# Patient Record
Sex: Female | Born: 1940 | Race: White | Hispanic: No | Smoking: Never smoker
Health system: Southern US, Community
[De-identification: ages and names within clinical notes are randomized; demographics above are authoritative.]

## PROBLEM LIST (undated history)

## (undated) DIAGNOSIS — Z950 Presence of cardiac pacemaker: Secondary | ICD-10-CM

## (undated) DIAGNOSIS — I10 Essential (primary) hypertension: Secondary | ICD-10-CM

## (undated) DIAGNOSIS — I480 Paroxysmal atrial fibrillation: Secondary | ICD-10-CM

## (undated) DIAGNOSIS — I495 Sick sinus syndrome: Secondary | ICD-10-CM

## (undated) DIAGNOSIS — E785 Hyperlipidemia, unspecified: Secondary | ICD-10-CM

## (undated) HISTORY — DX: Hyperlipidemia, unspecified: E78.5

## (undated) HISTORY — DX: Presence of cardiac pacemaker: Z95.0

## (undated) HISTORY — DX: Essential (primary) hypertension: I10

## (undated) HISTORY — DX: Paroxysmal atrial fibrillation: I48.0

## (undated) HISTORY — DX: Sick sinus syndrome: I49.5

---

## 2004-04-23 ENCOUNTER — Ambulatory Visit: Payer: Self-pay | Admitting: Cardiology

## 2004-11-06 ENCOUNTER — Ambulatory Visit: Payer: Self-pay | Admitting: Internal Medicine

## 2004-11-11 ENCOUNTER — Ambulatory Visit: Payer: Self-pay | Admitting: Internal Medicine

## 2005-01-20 ENCOUNTER — Ambulatory Visit: Payer: Self-pay | Admitting: Internal Medicine

## 2005-02-12 ENCOUNTER — Ambulatory Visit: Payer: Self-pay | Admitting: Internal Medicine

## 2005-02-12 ENCOUNTER — Ambulatory Visit (HOSPITAL_COMMUNITY): Admission: RE | Admit: 2005-02-12 | Discharge: 2005-02-12 | Payer: Self-pay | Admitting: Internal Medicine

## 2005-04-02 ENCOUNTER — Ambulatory Visit: Payer: Self-pay | Admitting: Internal Medicine

## 2005-04-24 ENCOUNTER — Ambulatory Visit: Payer: Self-pay | Admitting: Cardiology

## 2005-10-08 ENCOUNTER — Ambulatory Visit: Payer: Self-pay | Admitting: Internal Medicine

## 2006-05-10 ENCOUNTER — Ambulatory Visit: Payer: Self-pay | Admitting: Cardiology

## 2006-09-17 ENCOUNTER — Ambulatory Visit: Payer: Self-pay | Admitting: Physician Assistant

## 2006-09-23 ENCOUNTER — Ambulatory Visit: Payer: Self-pay | Admitting: Cardiology

## 2006-09-24 ENCOUNTER — Ambulatory Visit: Payer: Self-pay | Admitting: Cardiology

## 2006-10-29 ENCOUNTER — Ambulatory Visit: Payer: Self-pay | Admitting: Gastroenterology

## 2007-01-10 ENCOUNTER — Ambulatory Visit: Payer: Self-pay | Admitting: Cardiology

## 2008-10-24 ENCOUNTER — Ambulatory Visit: Payer: Self-pay | Admitting: Cardiology

## 2008-10-30 ENCOUNTER — Ambulatory Visit: Payer: Self-pay | Admitting: Cardiology

## 2008-11-07 ENCOUNTER — Telehealth: Payer: Self-pay | Admitting: Cardiology

## 2008-11-09 ENCOUNTER — Encounter (INDEPENDENT_AMBULATORY_CARE_PROVIDER_SITE_OTHER): Payer: Self-pay | Admitting: *Deleted

## 2008-11-26 ENCOUNTER — Encounter: Payer: Self-pay | Admitting: Cardiology

## 2008-11-26 DIAGNOSIS — R079 Chest pain, unspecified: Secondary | ICD-10-CM | POA: Insufficient documentation

## 2008-11-26 DIAGNOSIS — I4891 Unspecified atrial fibrillation: Secondary | ICD-10-CM | POA: Insufficient documentation

## 2008-11-26 DIAGNOSIS — E785 Hyperlipidemia, unspecified: Secondary | ICD-10-CM

## 2008-11-26 DIAGNOSIS — I10 Essential (primary) hypertension: Secondary | ICD-10-CM | POA: Insufficient documentation

## 2008-11-27 ENCOUNTER — Encounter: Payer: Self-pay | Admitting: Cardiology

## 2008-11-27 ENCOUNTER — Ambulatory Visit: Payer: Self-pay | Admitting: Cardiology

## 2008-11-27 DIAGNOSIS — R55 Syncope and collapse: Secondary | ICD-10-CM | POA: Insufficient documentation

## 2008-12-07 ENCOUNTER — Encounter: Payer: Self-pay | Admitting: Cardiology

## 2008-12-13 ENCOUNTER — Ambulatory Visit: Payer: Self-pay | Admitting: Cardiology

## 2008-12-13 ENCOUNTER — Inpatient Hospital Stay (HOSPITAL_COMMUNITY): Admission: AD | Admit: 2008-12-13 | Discharge: 2008-12-14 | Payer: Self-pay | Admitting: Internal Medicine

## 2008-12-13 ENCOUNTER — Ambulatory Visit: Payer: Self-pay | Admitting: Internal Medicine

## 2008-12-13 ENCOUNTER — Telehealth: Payer: Self-pay | Admitting: Cardiology

## 2008-12-13 ENCOUNTER — Encounter: Payer: Self-pay | Admitting: Internal Medicine

## 2008-12-13 HISTORY — PX: PACEMAKER PLACEMENT: SHX43

## 2008-12-14 ENCOUNTER — Telehealth (INDEPENDENT_AMBULATORY_CARE_PROVIDER_SITE_OTHER): Payer: Self-pay | Admitting: *Deleted

## 2008-12-14 ENCOUNTER — Encounter: Payer: Self-pay | Admitting: Internal Medicine

## 2008-12-21 ENCOUNTER — Telehealth (INDEPENDENT_AMBULATORY_CARE_PROVIDER_SITE_OTHER): Payer: Self-pay | Admitting: *Deleted

## 2008-12-24 ENCOUNTER — Encounter: Payer: Self-pay | Admitting: Cardiology

## 2008-12-27 ENCOUNTER — Ambulatory Visit: Payer: Self-pay | Admitting: Cardiology

## 2008-12-27 ENCOUNTER — Encounter (INDEPENDENT_AMBULATORY_CARE_PROVIDER_SITE_OTHER): Payer: Self-pay | Admitting: *Deleted

## 2008-12-27 DIAGNOSIS — Z950 Presence of cardiac pacemaker: Secondary | ICD-10-CM | POA: Insufficient documentation

## 2008-12-27 DIAGNOSIS — I495 Sick sinus syndrome: Secondary | ICD-10-CM

## 2008-12-27 DIAGNOSIS — G4730 Sleep apnea, unspecified: Secondary | ICD-10-CM | POA: Insufficient documentation

## 2008-12-31 ENCOUNTER — Ambulatory Visit: Payer: Self-pay

## 2008-12-31 ENCOUNTER — Encounter: Payer: Self-pay | Admitting: Internal Medicine

## 2009-01-08 ENCOUNTER — Telehealth (INDEPENDENT_AMBULATORY_CARE_PROVIDER_SITE_OTHER): Payer: Self-pay | Admitting: *Deleted

## 2009-01-09 ENCOUNTER — Encounter: Payer: Self-pay | Admitting: Cardiology

## 2009-01-11 ENCOUNTER — Encounter: Payer: Self-pay | Admitting: Cardiology

## 2009-03-22 ENCOUNTER — Ambulatory Visit: Payer: Self-pay | Admitting: Internal Medicine

## 2009-05-20 ENCOUNTER — Encounter: Payer: Self-pay | Admitting: Internal Medicine

## 2009-09-20 ENCOUNTER — Ambulatory Visit: Payer: Self-pay | Admitting: Internal Medicine

## 2010-03-25 ENCOUNTER — Ambulatory Visit: Payer: Self-pay | Admitting: Internal Medicine

## 2010-05-15 NOTE — Letter (Signed)
Summary: to DMV  to DMV   Imported By: Kassie Mends 05/20/2009 14:55:03  _____________________________________________________________________  External Attachment:    Type:   Image     Comment:   External Document

## 2010-05-15 NOTE — Assessment & Plan Note (Signed)
Summary: 6 mo fu per dec reminder   Visit Type:  Pacemaker check Primary Provider:  Vyas   History of Present Illness: The patient presents today for routine electrophysiology followup. She reports doing very well since last being seen in our clinic. The patient denies symptoms of palpitations, chest pain, shortness of breath, orthopnea, PND, lower extremity edema, dizziness, presyncope, syncope, or neurologic sequela. The patient is tolerating medications without difficulties and is otherwise without complaint today.   Preventive Screening-Counseling & Management  Alcohol-Tobacco     Smoking Status: never  Current Medications (verified): 1)  Nexium 40 Mg  Cpdr (Esomeprazole Magnesium) .Marland Kitchen.. 1 Capsule Each Day 30 Minutes Before Meal 2)  Flecainide Acetate 50 Mg Tabs (Flecainide Acetate) .... Take 1 Tablet By Mouth Twice A Day 3)  Lipitor 80 Mg Tabs (Atorvastatin Calcium) .... Take One Tablet By Mouth Daily. 4)  Warfarin Sodium 5 Mg Tabs (Warfarin Sodium) .... Use As Directed By Anticoagulation Clinic 5)  Atenolol 50 Mg Tabs (Atenolol) .... Take One Tablet By Mouth Daily 6)  Multivitamins   Tabs (Multiple Vitamin) .... Once Daily 7)  Triamcinolone Acetonide 0.1 % Crea (Triamcinolone Acetonide) .... Apply Topically Two Times A Day  Allergies (verified): No Known Drug Allergies  Comments:  Nurse/Medical Assistant: The patient's medication bottles and allergies were reviewed with the patient and were updated in the Medication and Allergy Lists.  Past History:  Past Medical History: Reviewed history from 12/27/2008 and no changes required. ATRIAL FIBRILLATION (ICD-427.31) HYPERLIPIDEMIA-MIXED (ICD-272.4) HYPERTENSION, UNSPECIFIED (ICD-401.9) CHEST PAIN-UNSPECIFIED (ICD-786.50) sick sinus syndrome dual-chamber pacemaker implantation    Social History: Reviewed history from 11/27/2008 and no changes required. patient denies tobacco use  Review of Systems       All systems  are reviewed and negative except as listed in the HPI.   Vital Signs:  Patient profile:   70 year old female Height:      64 inches Weight:      156 pounds BMI:     26.87 Pulse rate:   75 / minute BP sitting:   138 / 82  (left arm) Cuff size:   regular  Vitals Entered By: Carlye Grippe (March 25, 2010 1:32 PM)  Nutrition Counseling: Patient's BMI is greater than 25 and therefore counseled on weight management options.  Physical Exam  General:  Well developed, well nourished, in no acute distress. Head:  normocephalic and atraumatic Eyes:  PERRLA/EOM intact; conjunctiva and lids normal. Mouth:  Teeth, gums and palate normal. Oral mucosa normal. Neck:  Neck supple, no JVD. No masses, thyromegaly or abnormal cervical nodes. Chest Wall:  pacemaker pocket is well healed Lungs:  Clear bilaterally to auscultation and percussion. Heart:  Non-displaced PMI, chest non-tender; regular rate and rhythm, S1, S2 without murmurs, rubs or gallops. Carotid upstroke normal, no bruit. Normal abdominal aortic size, no bruits. Femorals normal pulses, no bruits. Pedals normal pulses. No edema, no varicosities. Abdomen:  Bowel sounds positive; abdomen soft and non-tender without masses, organomegaly, or hernias noted. No hepatosplenomegaly. Msk:  Back normal, normal gait. Muscle strength and tone normal. Extremities:  No clubbing or cyanosis. Neurologic:  Alert and oriented x 3.   PPM Specifications Following MD:  Hillis Range, MD     PPM Vendor:  [employer]onic     PPM Model Number:  ADDRL1     PPM Serial Number:  [id number] PPM DOI:  12/13/2008     PPM Implanting MD:  Hillis Range, MD  Lead 1    Location: RA  DOI: 12/13/2008     Model #: [id number]     Serial #: [id number]     Status: active Lead 2    Location: RV     DOI: 12/13/2008     Model #: 1914     Serial #: [id number]     Status: active  Magnet Response Rate:  BOL 85 ERI  65  Indications:  Sick sinus syndrome   PPM Follow Up Battery  Voltage:  2.80 V     Battery Est. Longevity:  13.5 yrs     Pacer Dependent:  No       PPM Device Measurements Atrium  Amplitude: 2.80 mV, Impedance: 431 ohms, Threshold: 0.50 V at 0.40 msec Right Ventricle  Amplitude: 15.68 mV, Impedance: 613 ohms, Threshold: 0.50 V at 0.40 msec  Episodes MS Episodes:  106     Percent Mode Switch:  0.2%     Coumadin:  Yes Ventricular High Rate:  1     Atrial Pacing:  83.5%     Ventricular Pacing:  0.2%  Parameters Mode:  MVP (R)     Lower Rate Limit:  60     Upper Rate Limit:  130 Paced AV Delay:  150     Sensed AV Delay:  120 Next Cardiology Appt Due:  09/12/2010 Tech Comments:  106 MODE SWITCHES--LONGEST WAS 1 HR 19 MINUTES. + COUMADIN. 1 VHR EPISODE LASTING 4 SECONDS. NORMAL DEVICE FUNCTION.  CHANGED RA OUTPUT FROM 1.5 TO 2.0 AND RV OUTPUT FROM 2.0 TO 2.5 V.  ROV IN 6 MTHS W/DEVICE CLINIC. Vella Kohler  March 25, 2010 1:35 PM MD Comments:  agree  Impression & Recommendations:  Problem # 1:  SICK SINUS SYNDROME (ICD-427.81)  Normal pacemaker function no changes today  Problem # 2:  ATRIAL FIBRILLATION (ICD-427.31) maintaining sinus with low dose flecainide no changes today  we will consider stress testing upon return  Problem # 3:  HYPERTENSION, UNSPECIFIED (ICD-401.9) stable no changes Her updated medication list for this problem includes:    Atenolol 50 Mg Tabs (Atenolol) .Marland Kitchen... Take one tablet by mouth daily

## 2010-05-15 NOTE — Cardiovascular Report (Signed)
Summary: [facility]/ INTERROGATION REPORT  [facility] Device Clinic/ INTERROGATION REPORT   Imported By: Dorise Hiss 03/26/2010 10:30:04  _____________________________________________________________________  External Attachment:    Type:   Image     Comment:   External Document

## 2010-05-15 NOTE — Cardiovascular Report (Signed)
Summary: Card Device Clinic/ FINAL REPORT  [facility] Device Clinic/ FINAL REPORT   Imported By: Dorise Hiss 09/24/2009 14:42:22  _____________________________________________________________________  External Attachment:    Type:   Image     Comment:   External Document

## 2010-05-15 NOTE — Assessment & Plan Note (Signed)
Summary: 6 MONTH PC 2   Visit Type:  Pacemaker check Primary Provider:  Vyas  CC:  pacemaker check.  History of Present Illness: The patient presents today for routine electrophysiology followup. She reports doing very well since last being seen in our clinic. The patient denies symptoms of palpitations, chest pain, shortness of breath, orthopnea, PND, lower extremity edema, dizziness, presyncope, syncope, or neurologic sequela. The patient is tolerating medications without difficulties and is otherwise without complaint today.   Preventive Screening-Counseling & Management  Alcohol-Tobacco     Smoking Status: never  Current Medications (verified): 1)  Nexium 40 Mg  Cpdr (Esomeprazole Magnesium) .Marland Kitchen.. 1 Capsule Each Day 30 Minutes Before Meal 2)  Flecainide Acetate 50 Mg Tabs (Flecainide Acetate) .... Take 1 Tablet By Mouth Twice A Day 3)  Lipitor 80 Mg Tabs (Atorvastatin Calcium) .... Take One Tablet By Mouth Daily. 4)  Warfarin Sodium 5 Mg Tabs (Warfarin Sodium) .... Use As Directed By Anticoagulation Clinic 5)  Atenolol 50 Mg Tabs (Atenolol) .... Take One Tablet By Mouth Daily 6)  Multivitamins   Tabs (Multiple Vitamin) .... Once Daily  Allergies (verified): No Known Drug Allergies  Comments:  Nurse/Medical Assistant: The patient's medications were reviewed with the patient and were updated in the Medication List.  Past History:  Past Medical History: Reviewed history from 12/27/2008 and no changes required. ATRIAL FIBRILLATION (ICD-427.31) HYPERLIPIDEMIA-MIXED (ICD-272.4) HYPERTENSION, UNSPECIFIED (ICD-401.9) CHEST PAIN-UNSPECIFIED (ICD-786.50) sick sinus syndrome dual-chamber pacemaker implantation    Social History: Reviewed history from 11/27/2008 and no changes required. patient denies tobacco use  Vital Signs:  Patient profile:   70 year old female Height:      64 inches Weight:      162.8 pounds O2 Sat:      98 % on Room air Pulse rate:   75 / minute BP  sitting:   138 / 72  (left arm)  Vitals Entered By: Youlanda Mighty RN (September 20, 2009 1:28 PM)  O2 Flow:  Room air CC: pacemaker check   Physical Exam  General:  Well developed, well nourished, in no acute distress. Head:  normocephalic and atraumatic Eyes:  PERRLA/EOM intact; conjunctiva and lids normal. Mouth:  Teeth, gums and palate normal. Oral mucosa normal. Neck:  Neck supple, no JVD. No masses, thyromegaly or abnormal cervical nodes. Chest Wall:  pacemaker pocket is well healed Lungs:  Clear bilaterally to auscultation and percussion. Heart:  Non-displaced PMI, chest non-tender; regular rate and rhythm, S1, S2 without murmurs, rubs or gallops. Carotid upstroke normal, no bruit. Normal abdominal aortic size, no bruits. Femorals normal pulses, no bruits. Pedals normal pulses. No edema, no varicosities. Abdomen:  Bowel sounds positive; abdomen soft and non-tender without masses, organomegaly, or hernias noted. No hepatosplenomegaly. Msk:  Back normal, normal gait. Muscle strength and tone normal. Pulses:  pulses normal in all 4 extremities Extremities:  No clubbing or cyanosis. Neurologic:  Alert and oriented x 3.   PPM Specifications Following MD:  Hillis Range, MD     PPM Vendor:  [employer]     PPM Model Number:  ADDRL1     PPM Serial Number:  [id number] PPM DOI:  12/13/2008     PPM Implanting MD:  Hillis Range, MD  Lead 1    Location: RA     DOI: 12/13/2008     Model #: 4403     Serial #: [id number]     Status: active Lead 2    Location: RV     DOI:  12/13/2008     Model #: [id number]     Serial #: [id number]     Status: active  Magnet Response Rate:  BOL 85 ERI  65  Indications:  Sick sinus syndrome   PPM Follow Up Remote Check?  No Battery Voltage:  2.79 V     Battery Est. Longevity:  13.5 years     Pacer Dependent:  No       PPM Device Measurements Atrium  Amplitude: 2.0 mV, Impedance: 466 ohms, Threshold: 0.5 V at 0.4 msec Right Ventricle  Amplitude: 11.2 mV,  Impedance: 578 ohms, Threshold: 1.0 V at 0.4 msec  Episodes MS Episodes:  392     Percent Mode Switch:  0.5%     Coumadin:  Yes Ventricular High Rate:  0     Atrial Pacing:  88%     Ventricular Pacing:  0.2%  Parameters Mode:  MVP (R)     Lower Rate Limit:  60     Upper Rate Limit:  130 Paced AV Delay:  150     Sensed AV Delay:  120 Next Cardiology Appt Due:  12/12/2009 Tech Comments:  No parameter changes.  Device function normal.  ROV 6months with Dr. Johney Frame in [place]. Altha Harm, LPN  September 20, 2009 1:47 PM  MD Comments:  agree  Impression & Recommendations:  Problem # 1:  SICK SINUS SYNDROME (ICD-427.81) Normal pacemaker function no changes today  Problem # 2:  ATRIAL FIBRILLATION (ICD-427.31) maintaining sinus with low dose flecainide we discussed pradaxa as an alternative to coumadin.  She wishes to continue coumadin at this time. no changes today  Problem # 3:  HYPERTENSION, UNSPECIFIED (ICD-401.9) stable no changes  Patient Instructions: 1)  return in 6 months

## 2010-07-18 LAB — PROTIME-INR
INR: 1.8 — ABNORMAL HIGH (ref 0.00–1.49)
Prothrombin Time: 20.9 seconds — ABNORMAL HIGH (ref 11.6–15.2)

## 2010-08-26 NOTE — Assessment & Plan Note (Signed)
[facility]                          [facility] OFFICE NOTE   Anna Sutton, Anna Sutton                       MRN:          [id number]  DATE:01/10/2007                            DOB:          Oct 03, 1940    HISTORY OF PRESENT ILLNESS:  The patient is a 70 year old female with no  prior history of coronary artery disease. The patient has a history of  paroxysmal atrial fibrillation, but has been maintained on Tambocor  therapy. She reports no chest pain, shortness of breath, orthopnea, PND.  The patient has remained very active and has good functional status.   The patient denies any palpitations or syncope.   CURRENT MEDICATIONS:  1. Atenolol 25 mg p.o. daily.  2. Tambocor 50 mg p.o. b.i.d.  3. Lipitor 40 mg p.o. q nightly.  4. Coumadin as directed.  5. Nexium 40 mg p.o. daily.   PHYSICAL EXAMINATION:  VITAL SIGNS: Blood pressure 165/73, heart rate 55  beats per minute, weight is 163 pounds.  NECK: Normal carotid upstroke. No carotid bruits.  LUNGS:  Clear breath sounds bilaterally.  HEART: Regular rate and rhythm. Normal S1, S2. No murmur, rubs or  gallops.  ABDOMEN: Soft and nontender. No rebound or guarding. Good bowel sounds.  EXTREMITIES: No cyanosis, clubbing or edema.  NEURO: The patient is alert, oriented and grossly nonfocal.   PROBLEM LIST:  1. History of atypical chest pain with negative Cardiolite stress      study.  2. Paroxysmal atrial fibrillation, on Tambocor.  3. Chronic Coumadin therapy.  4. Hyperlipidemia.  5. Hypertension.   PLAN:  1. The patient is doing well from a cardiovascular perspective. The      patient reports no chest pain or shortness of breath.  2. Atrial fibrillation, is quiescent. The patient can continue on      Tambocor.     Learta Codding, MD,FACC  Electronically Signed    GED/MedQ  DD: 01/10/2007  DT: 01/10/2007  Job #: [id number]
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
## 2010-08-26 NOTE — Assessment & Plan Note (Signed)
NAME:  Anna Sutton, Anna Sutton                 CHART#:  [id number]   DATE:  10/29/2006                       DOB:  08-12-1940   CHIEF COMPLAINT:  Followup GERD.   SUBJECTIVE:  The patient is a 70 year old female with a history of  chronic GERD and erosive esophagitis.  She was last seen on 10/08/2005.  She has been taking Nexium 40 mg daily.  Denies any breakthrough  symptoms including heartburn, indigestion, denies any rectal bleeding or  melena.  Her weight has remained stable.  She is on Coumadin and is  thought she should remain on PPI indefinitely given her risk of  bleeding.   CURRENT MEDICATIONS:  See the list from 10/29/2006.   ALLERGIES:  No known drug allergies.   OBJECTIVE:   PHYSICAL EXAMINATION:  VITAL SIGNS:  Weight 166 pounds, height 64 and  1/2 inches, temp 97.7, blood pressure 142/80, and pulse 56.  GENERAL:  The patient is an elderly female who is alert, oriented,  pleasant, cooperative in no acute distress.  HEENT:  Sclerae are clear.  Nonicteric.  Conjunctivae pink.  Oropharynx  pink and moist without any without any lesions.  CHEST:  Heart regular rate and rhythm, normal S1 S2.  ABDOMEN:  Positive bowel sounds x4.  No bruits auscultated.  Soft,  nontender, nondistended.  No palpable mass or hepatosplenomegaly.  No  involuntary guarding.  EXTREMITIES:  Without clubbing or edema bilaterally.   ASSESSMENT:  The patient is a 70 year old female with a history of  gastroesophageal reflux disease, erosive reflux esophagitis well  controlled on proton pump inhibitor.  She is on long term Coumadin and  she needs to be on lifelong proton pump inhibitor.   PLAN:  1. She is due for a screening colonoscopy in 2015.  2. Continue Nexium 40 mg daily indefinitely.  3. Follow up office visit in 2 years or sooner if needed.       Lorenza Burton, N.P.  Electronically Signed     Kassie Mends, M.D.  Electronically Signed    KJ/MEDQ  D:  10/29/2006  T:  10/29/2006  Job:   [id number]   cc:   Doreen Beam
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
## 2010-08-29 NOTE — Op Note (Signed)
NAME:  Anna Sutton, Anna Sutton                ACCOUNT NO.:  [id number]   MEDICAL RECORD NO.:  [id number]          PATIENT TYPE:  AMB   LOCATION:  DAY                           FACILITY:  [facility]   PHYSICIAN:  R. Roetta Sessions, M.D. DATE OF BIRTH:  1940/12/08   DATE OF PROCEDURE:  02/12/2005  DATE OF DISCHARGE:                                 OPERATIVE REPORT   PROCEDURE:  Diagnostic esophagogastroduodenoscopy.   INDICATIONS FOR PROCEDURE:  The patient is a 70 year old lady with Hemoccult  positive stool. She is not currently anemic. CBC from [facility]  recently showed H and H 12 and 37. She really does not have any upper GI  symptoms. No odynophagia, dysphagia, early satiety, reflux symptoms, nausea  or vomiting. No recent abdominal pain. No lower GI tract symptoms.  Colonoscopy by Dr. Cleotis Nipper last year at [facility] demonstrated no  significant abnormalities. EGD is now being done to further evaluate her  symptoms. This approach has been discussed with the patient at length.  Potential risks, benefits, and alternatives have been reviewed and questions  answered. Please see documentation in the medical record.   PROCEDURE NOTE:  O2 saturation, blood pressure, pulse, and respirations were  monitored throughout the entire procedure. Conscious sedation with Versed  and Demerol in incremental doses.   INSTRUMENT:  Olympus video chip system.   FINDINGS:  Esophagogastroduodenoscopy:  Examination of the tubular esophagus  revealed four-quadrant deep esophageal erosions straddling a noncritical  Schatzki's ring. Esophageal mucosa otherwise appeared normal. EGD junction  was easily traversed.   Stomach:  Gastric cavity was empty and insufflated well with air. Thorough  examination of gastric mucosa including retroflexed view of the proximal  stomach and esophagogastric junction demonstrated only a few scattered  antral erosions. Small hiatal hernia. Pylorus patent and easily  traversed.  Examination of bulb and second portion revealed no abnormalities.   THERAPEUTIC/DIAGNOSTIC MANEUVERS:  None.   The patient tolerated the procedure well and was reactive to endoscopy.   IMPRESSION:  Four-quadrant distal esophageal erosions consistent with  erosive reflux esophagitis. Noncritical Schatzki's ring. Otherwise normal  esophagus. Small hiatal hernia. Scattered antral erosions. Otherwise normal  gastric mucosa. Patent pylorus. Normal D1 and D2.   Either the erosions in the antrum or the erosions in the esophagus could  conceivably produce hemoccult positive stool. She is not anemic.   She is really devoid of any GI tract symptoms although she certainly does  have reflux related injury distal to the esophagus.   RECOMMENDATIONS:  1.  Gastroesophageal reflux disease literature provided to Ms. Lemay.  2.  Course of Aciphex 20 mg orally daily 1 tablet before breakfast. She is      to go by office for free samples. Prescription given. I will plan to see      this nice lady back in the office in eight weeks.      Jonathon Bellows, M.D.  Electronically Signed     RMR/MEDQ  D:  02/12/2005  T:  02/12/2005  Job:  [id number]   cc:   Doreen Beam  Fax: [phone]

## 2010-08-29 NOTE — Assessment & Plan Note (Signed)
[facility]                          [facility] OFFICE NOTE   MELONI, HINZ                       MRN:          [id number]  DATE:05/10/2006                            DOB:          05-Aug-1940    HISTORY OF PRESENT ILLNESS:  The patient is a 70 year old female with a  history of paroxysmal atrial fibrillation.  The patient has been doing  well.  She reports no breakthrough episodes.  She reports no chest pain.  She also reports no shortness of breath.  She reports no substernal  chest pain.  She is able to tolerate her Tambocor therapy without any  difficulty.   MEDICATIONS:  1. Atenolol 25 mg a day.  2. Tambocor 50 mg p.o. b.i.d.  3. Lipitor 40 mg p.o. nightly.  4. Coumadin as directed.  5. Nexium 40 mg a day.   PHYSICAL EXAMINATION:  Blood pressure 140/70, heart rate 80 beats per  minute.  GENERAL:  Well-nourished white female in no apparent distress.  HEENT:  Pupils and eyes clear.  Conjunctivae clear.  NECK:  Supple.  Normal carotid upstroke.  No carotid bruits.  LUNGS:  Clear breath sounds bilaterally.  HEART:  Regular rate and rhythm.  Normal S1, S2.  No murmurs, rubs, or  gallops.  ABDOMEN:  Soft.  EXTREMITIES:  No cyanosis, clubbing, or edema.   PROBLEM LIST:  1. Paroxysmal atrial fibrillation.      a.     Normal sinus rhythm.      b.     On Tambocor therapy.      c.     No recurrent symptoms.  2. Dyslipidemia.  3. Hypertension.  4. Systolic murmur secondary to benign aortic murmur.   PLAN:  1. The patient can follow up with [place] in 6 months.  2. I have made no change in her medical therapy.     Learta Codding, MD,FACC  Electronically Signed    GED/MedQ  DD: 05/10/2006  DT: 05/10/2006  Job #: [id number]
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
## 2010-12-11 ENCOUNTER — Encounter: Payer: Self-pay | Admitting: *Deleted

## 2010-12-11 ENCOUNTER — Ambulatory Visit (INDEPENDENT_AMBULATORY_CARE_PROVIDER_SITE_OTHER): Payer: Self-pay | Admitting: *Deleted

## 2010-12-11 DIAGNOSIS — I495 Sick sinus syndrome: Secondary | ICD-10-CM

## 2010-12-11 DIAGNOSIS — I4891 Unspecified atrial fibrillation: Secondary | ICD-10-CM

## 2010-12-11 LAB — PACEMAKER DEVICE OBSERVATION
AL AMPLITUDE: 5.6 mv
AL THRESHOLD: 0.5 V
BAMS-0001: 150 {beats}/min
RV LEAD AMPLITUDE: 15.68 mv

## 2010-12-11 NOTE — Progress Notes (Signed)
Pacer check in clinic  

## 2011-01-05 ENCOUNTER — Encounter: Payer: Medicare Other | Admitting: Internal Medicine

## 2011-03-06 ENCOUNTER — Encounter: Payer: Self-pay | Admitting: Cardiology

## 2011-09-10 ENCOUNTER — Encounter: Payer: Self-pay | Admitting: Internal Medicine

## 2011-09-10 ENCOUNTER — Telehealth: Payer: Self-pay | Admitting: Internal Medicine

## 2011-09-10 NOTE — Telephone Encounter (Signed)
09-10-11 called pt n/a, mailbox full, sent past due letter/mt

## 2011-12-31 ENCOUNTER — Encounter: Payer: Self-pay | Admitting: Internal Medicine

## 2011-12-31 ENCOUNTER — Ambulatory Visit (INDEPENDENT_AMBULATORY_CARE_PROVIDER_SITE_OTHER): Payer: Medicare Other | Admitting: Internal Medicine

## 2011-12-31 VITALS — BP 160/80 | HR 73 | Ht 64.0 in | Wt 161.0 lb

## 2011-12-31 DIAGNOSIS — I495 Sick sinus syndrome: Secondary | ICD-10-CM

## 2011-12-31 DIAGNOSIS — I10 Essential (primary) hypertension: Secondary | ICD-10-CM

## 2011-12-31 DIAGNOSIS — Z950 Presence of cardiac pacemaker: Secondary | ICD-10-CM

## 2011-12-31 DIAGNOSIS — I4891 Unspecified atrial fibrillation: Secondary | ICD-10-CM

## 2011-12-31 LAB — PACEMAKER DEVICE OBSERVATION
AL AMPLITUDE: 5.6 mv
AL IMPEDENCE PM: 481 Ohm
BAMS-0001: 150 {beats}/min
BATTERY VOLTAGE: 2.79 V
RV LEAD AMPLITUDE: 22.4 mv
VENTRICULAR PACING PM: 0

## 2011-12-31 MED ORDER — ATENOLOL 100 MG PO TABS: 100.0000 mg | ORAL_TABLET | Freq: Every day | ORAL | Status: AC

## 2011-12-31 NOTE — Addendum Note (Signed)
Addended by: Eustace Moore on: 12/31/2011 09:50 AM   Modules accepted: Orders

## 2011-12-31 NOTE — Patient Instructions (Addendum)
Your physician recommends that you schedule a follow-up appointment in: 1 year. You will receive a reminder letter in the mail in about 10 months reminding you to call and schedule your appointment. If you don't receive this letter, please contact our office. Next phone check is scheduled for 04/04/12. Your physician has recommended you make the following change in your medication: Increased atenolol to 100 mg daily. You may take 2 of your 50 mg tablets until they are finished. Your new prescription has been sent to your pharmacy.

## 2011-12-31 NOTE — Progress Notes (Signed)
PCP: Ignatius Specking., MD  Anna Sutton is a 71 y.o. female who presents today for routine electrophysiology followup.  She has not been seen in my clinic for 2 years.  Since last being seen in our clinic, the patient reports doing very well.  She remains active.  Today, she denies symptoms of palpitations, chest pain, shortness of breath,  lower extremity edema, dizziness, presyncope, or syncope.  The patient is otherwise without complaint today.   Past Medical History  Diagnosis Date  . Paroxysmal atrial fibrillation   . Hyperlipidemia   . Hypertension   . Sick sinus syndrome     s/p PPM (MDT)  . Pacemaker    Past Surgical History  Procedure Date  . Pacemaker placement 12/13/08    MDT Adapta L implanted by Dr Johney Frame    Current Outpatient Prescriptions  Medication Sig Dispense Refill  . atenolol (TENORMIN) 50 MG tablet Take 50 mg by mouth daily.        Marland Kitchen donepezil (ARICEPT) 10 MG tablet Take 10 mg by mouth at bedtime as needed.        . Multiple Vitamin (MULTIVITAMIN) tablet Take 1 tablet by mouth daily.        . rivaroxaban (XARELTO) 10 MG TABS tablet Take 10 mg by mouth daily.        Pt is unable to confirm medicines or doses today.  She will call us with this information  Physical Exam: Filed Vitals:   12/31/11 0906  BP: 160/80  Pulse: 73  Height: 5\' 4"  (1.626 m)  Weight: 161 lb (73.029 kg)  SpO2: 97%    GEN- The patient is well appearing, alert and oriented x 3 today.   Head- normocephalic, atraumatic Eyes-  Sclera clear, conjunctiva pink Ears- hearing intact Oropharynx- clear Lungs- Clear to ausculation bilaterally, normal work of breathing Chest- pacemaker pocket is well healed Heart- Regular rate and rhythm, no murmurs, rubs or gallops, PMI not laterally displaced GI- soft, NT, ND, + BS Extremities- no clubbing, cyanosis, or edema  Pacemaker interrogation- reviewed in detail today,  See PACEART report  Assessment and Plan:  Problem # 1: SICK SINUS SYNDROME    Normal pacemaker function  no changes today   Problem # 2: ATRIAL FIBRILLATION (ICD-427.31)  She is not sure if she still takes flecainide She will call us with this information Her chart suggests that her xarelto dose is 10mg  daily.  This would be an inappropriate dose for prevention of stroke with afib.  She will call us and tell us what dose she is taking.  Her pacemaker reveals episodes of afib with very fast ventricular conduction. I will increase atenolol to 100mg  daily today.  Problem # 3: HYPERTENSION, UNSPECIFIED (ICD-401.9)  Above goal Increase atenolol to 100mg  daily Follow-up with Dr Sherril Croon

## 2012-04-04 ENCOUNTER — Encounter: Payer: Medicare Other | Admitting: *Deleted

## 2012-04-12 ENCOUNTER — Encounter: Payer: Self-pay | Admitting: *Deleted

## 2012-05-13 ENCOUNTER — Encounter: Payer: Self-pay | Admitting: *Deleted

## 2012-06-01 ENCOUNTER — Telehealth: Payer: Self-pay | Admitting: Internal Medicine

## 2012-06-01 NOTE — Telephone Encounter (Signed)
Pt has received remote box and needs help setting it up and was told to call office

## 2012-06-02 NOTE — Telephone Encounter (Signed)
Spoke w/granddaughter and instructed on how to send transmission. Granddaughter to send transmission in next day or so. Explained letter would be sent with next transmission date once doctor reviews.

## 2012-08-08 ENCOUNTER — Ambulatory Visit (INDEPENDENT_AMBULATORY_CARE_PROVIDER_SITE_OTHER): Payer: Medicare Other | Admitting: *Deleted

## 2012-08-08 ENCOUNTER — Other Ambulatory Visit: Payer: Self-pay | Admitting: Internal Medicine

## 2012-08-08 ENCOUNTER — Encounter: Payer: Self-pay | Admitting: Internal Medicine

## 2012-08-08 DIAGNOSIS — I495 Sick sinus syndrome: Secondary | ICD-10-CM

## 2012-08-08 DIAGNOSIS — I4891 Unspecified atrial fibrillation: Secondary | ICD-10-CM

## 2012-08-08 LAB — PACEMAKER DEVICE OBSERVATION
AL AMPLITUDE: 5.6 mv
BAMS-0001: 150 {beats}/min
BATTERY VOLTAGE: 2.8 V
RV LEAD AMPLITUDE: 11.2 mv
RV LEAD THRESHOLD: 0.75 V

## 2012-08-08 NOTE — Progress Notes (Signed)
Pacemaker check in clinic. Normal device function. Battery longevity 11 years. Pt in AF 5.4 % of time. + Xarelto. No changes made. ROV in 6 mths w/JA in [place] office.

## 2012-12-14 ENCOUNTER — Encounter: Payer: Medicare Other | Admitting: Internal Medicine

## 2012-12-22 ENCOUNTER — Ambulatory Visit (INDEPENDENT_AMBULATORY_CARE_PROVIDER_SITE_OTHER): Payer: Medicare Other | Admitting: Internal Medicine

## 2012-12-22 ENCOUNTER — Encounter: Payer: Self-pay | Admitting: Internal Medicine

## 2012-12-22 VITALS — BP 146/86 | HR 76 | Ht 64.0 in | Wt 152.0 lb

## 2012-12-22 DIAGNOSIS — I495 Sick sinus syndrome: Secondary | ICD-10-CM

## 2012-12-22 DIAGNOSIS — Z950 Presence of cardiac pacemaker: Secondary | ICD-10-CM

## 2012-12-22 DIAGNOSIS — I4891 Unspecified atrial fibrillation: Secondary | ICD-10-CM

## 2012-12-22 DIAGNOSIS — I10 Essential (primary) hypertension: Secondary | ICD-10-CM

## 2012-12-22 DIAGNOSIS — R079 Chest pain, unspecified: Secondary | ICD-10-CM

## 2012-12-22 LAB — PACEMAKER DEVICE OBSERVATION
AL AMPLITUDE: 2 mv
AL IMPEDENCE PM: 403 Ohm
BAMS-0001: 150 {beats}/min
BATTERY VOLTAGE: 2.79 V
RV LEAD AMPLITUDE: 22.4 mv
RV LEAD IMPEDENCE PM: 664 Ohm
VENTRICULAR PACING PM: 0

## 2012-12-22 MED ORDER — DILTIAZEM HCL ER COATED BEADS 120 MG PO CP24: 120.0000 mg | ORAL_CAPSULE | Freq: Every day | ORAL | Status: DC

## 2012-12-22 NOTE — Patient Instructions (Signed)
   Begin Diltiazem CD 120mg  daily - new sent to pharm Continue all other medications.   Your physician wants you to follow up in: 6 months.  You will receive a reminder letter in the mail one-two months in advance.  If you don't receive a letter, please call our office to schedule the follow up appointment - Belenda Cruise (device check) Your physician wants you to follow up in:  1 year.  You will receive a reminder letter in the mail one-two months in advance.  If you don't receive a letter, please call our office to schedule the follow up appointment - ([place]) Establish with Dr. Wyline Mood for routine cardiac care

## 2012-12-22 NOTE — Progress Notes (Signed)
PCP: Ignatius Specking., MD  Anna Sutton is a 72 y.o. female who presents today for routine electrophysiology followup. Her dementia has advanced significantly.  She has difficulty telling me where she lives or who she lives with.  She is unable to tell me anything about her medicines. Today, she denies symptoms of palpitations, chest pain, shortness of breath,  lower extremity edema, dizziness, presyncope, or syncope.  The patient is otherwise without complaint today.   Past Medical History  Diagnosis Date  . Paroxysmal atrial fibrillation   . Hyperlipidemia   . Hypertension   . Sick sinus syndrome     s/p PPM (MDT)  . Pacemaker    Past Surgical History  Procedure Laterality Date  . Pacemaker placement  12/13/08    MDT Adapta L implanted by Dr Johney Frame    Current Outpatient Prescriptions  Medication Sig Dispense Refill  . atenolol (TENORMIN) 100 MG tablet Take 1 tablet (100 mg total) by mouth daily.  30 tablet  6  . donepezil (ARICEPT) 10 MG tablet Take 10 mg by mouth at bedtime as needed.        . Multiple Vitamin (MULTIVITAMIN) tablet Take 1 tablet by mouth daily.        . Rivaroxaban (XARELTO) 15 MG TABS tablet Take 15 mg by mouth daily.       No current facility-administered medications for this visit.  Pt is unable to confirm medicines or doses today.  She will call us with this information  Physical Exam: Filed Vitals:   12/22/12 1032  BP: 146/86  Pulse: 76  Height: 5\' 4"  (1.626 m)  Weight: 152 lb (68.947 kg)  SpO2: 97%    GEN- The patient is well appearing, alert but very confused  Head- normocephalic, atraumatic Eyes-  Sclera clear, conjunctiva pink Ears- hearing intact Oropharynx- clear Lungs- Clear to ausculation bilaterally, normal work of breathing Chest- pacemaker pocket is well healed Heart- tachycardic irregular rhythm GI- soft, NT, ND, + BS Extremities- no clubbing, cyanosis, or edema  Pacemaker interrogation- reviewed in detail today,  See PACEART  report  Assessment and Plan:  Problem # 1: tachycardia/ bradycardia Normal pacemaker function  no changes today   Problem # 2: ATRIAL FIBRILLATION   She takes xarelto 15mg  daily.  Dr Sherril Croon is following renal function.   Afib burden is only 6.5% however when in afib, her V rates are 140s.  She has not taken her atenolol today. I have encouraged compliance with medicines and discussed this with the patients granddaughter who is with her today. I will add diltiazem CD 120mg  daily today.  Problem # 3: HYPERTENSION  Stable  Return in 4 weeks to see Dr Wyline Mood to make sure she is tolerating diltiazem.  This can be uptitrated for afib if needed. A [employer] express transmission could be sent when she arrives to see how much afib she is having and what her V rates are at that time.  Return to see Belenda Cruise in 6 months I will see in a year

## 2012-12-29 ENCOUNTER — Encounter: Payer: Self-pay | Admitting: Internal Medicine

## 2013-02-01 ENCOUNTER — Encounter: Payer: Medicare Other | Admitting: Cardiology

## 2013-02-01 NOTE — Progress Notes (Signed)
     Clinical Summary Ms. Cheadle is a 72 y.o.female 1. Paroxysmal afib - followed by EP Dr Johney Frame - started on dilt during last visit with EP to take in addition with atenolol after 6.5% afib burden with rates in 140s noted on pacemaker check  2. HTN   3. HL   4. Sick sinus syndrome - prior pacemaker placement   3. Dementia  Past Medical History  Diagnosis Date  . Paroxysmal atrial fibrillation   . Hyperlipidemia   . Hypertension   . Sick sinus syndrome     s/p PPM (MDT)  . Pacemaker      No Known Allergies   Current Outpatient Prescriptions  Medication Sig Dispense Refill  . atenolol (TENORMIN) 100 MG tablet Take 1 tablet (100 mg total) by mouth daily.  30 tablet  6  . diltiazem (CARDIZEM CD) 120 MG 24 hr capsule Take 1 capsule (120 mg total) by mouth daily.  30 capsule  6  . donepezil (ARICEPT) 10 MG tablet Take 10 mg by mouth at bedtime as needed.        . Multiple Vitamin (MULTIVITAMIN) tablet Take 1 tablet by mouth daily.        . Rivaroxaban (XARELTO) 15 MG TABS tablet Take 15 mg by mouth daily.       No current facility-administered medications for this visit.     Past Surgical History  Procedure Laterality Date  . Pacemaker placement  12/13/08    MDT Adapta L implanted by Dr Johney Frame     No Known Allergies    Family History  Problem Relation Age of Onset  . Heart failure       Social History Ms. Rosemond reports that she has never smoked. She has never used smokeless tobacco. Ms. Aguado reports that she does not drink alcohol.   Review of Systems CONSTITUTIONAL: No weight loss, fever, chills, weakness or fatigue.  HEENT: Eyes: No visual loss, blurred vision, double vision or yellow sclerae.No hearing loss, sneezing, congestion, runny nose or sore throat.  SKIN: No rash or itching.  CARDIOVASCULAR:  RESPIRATORY: No shortness of breath, cough or sputum.  GASTROINTESTINAL: No anorexia, nausea, vomiting or diarrhea. No abdominal pain or blood.    GENITOURINARY: No burning on urination, no polyuria NEUROLOGICAL: No headache, dizziness, syncope, paralysis, ataxia, numbness or tingling in the extremities. No change in bowel or bladder control.  MUSCULOSKELETAL: No muscle, back pain, joint pain or stiffness.  LYMPHATICS: No enlarged nodes. No history of splenectomy.  PSYCHIATRIC: No history of depression or anxiety.  ENDOCRINOLOGIC: No reports of sweating, cold or heat intolerance. No polyuria or polydipsia.  Marland Kitchen   Physical Examination There were no vitals filed for this visit. There were no vitals filed for this visit.  Gen: resting comfortably, no acute distress HEENT: no scleral icterus, pupils equal round and reactive, no palptable cervical adenopathy,  CV Resp: Clear to auscultation bilaterally GI: abdomen is soft, non-tender, non-distended, normal bowel sounds, no hepatosplenomegaly MSK: extremities are warm, no edema.  Skin: warm, no rash Neuro:  no focal deficits Psych: appropriate affect   Diagnostic Studies     Assessment and Plan        Antoine Poche, M.D., F.A.C.C.

## 2013-06-23 ENCOUNTER — Other Ambulatory Visit: Payer: Self-pay

## 2013-06-23 MED ORDER — DILTIAZEM HCL ER COATED BEADS 120 MG PO CP24: 120.0000 mg | ORAL_CAPSULE | Freq: Every day | ORAL | Status: DC

## 2013-07-07 ENCOUNTER — Ambulatory Visit (INDEPENDENT_AMBULATORY_CARE_PROVIDER_SITE_OTHER): Payer: Medicare Other | Admitting: *Deleted

## 2013-07-07 ENCOUNTER — Encounter: Payer: Self-pay | Admitting: Internal Medicine

## 2013-07-07 DIAGNOSIS — I4891 Unspecified atrial fibrillation: Secondary | ICD-10-CM

## 2013-07-07 DIAGNOSIS — I495 Sick sinus syndrome: Secondary | ICD-10-CM

## 2013-07-07 LAB — MDC_IDC_ENUM_SESS_TYPE_INCLINIC
Battery Impedance: 251 Ohm
Battery Remaining Longevity: 110 mo
Battery Voltage: 2.8 V
Brady Statistic AS VP Percent: 0 %
Lead Channel Impedance Value: 449 Ohm
Lead Channel Pacing Threshold Amplitude: 1 V
Lead Channel Setting Pacing Amplitude: 2 V
Lead Channel Setting Pacing Amplitude: 2.5 V
Lead Channel Setting Pacing Pulse Width: 0.4 ms
Lead Channel Setting Sensing Sensitivity: 5.6 mV
MDC IDC MSMT LEADCHNL RA SENSING INTR AMPL: 1 mV
MDC IDC MSMT LEADCHNL RV IMPEDANCE VALUE: 712 Ohm
MDC IDC MSMT LEADCHNL RV PACING THRESHOLD PULSEWIDTH: 0.4 ms
MDC IDC MSMT LEADCHNL RV SENSING INTR AMPL: 15.67 mV
MDC IDC SESS DTM: 20150327134819
MDC IDC STAT BRADY AP VP PERCENT: 1 %
MDC IDC STAT BRADY AP VS PERCENT: 89 %
MDC IDC STAT BRADY AS VS PERCENT: 9 %

## 2013-07-07 NOTE — Progress Notes (Signed)
Pacemaker check in clinic. Battery longevity 9 years. Normal device function. Pt in AF 18% of time. + Xarelto. ROV in September with JA/[place].

## 2013-09-08 NOTE — Progress Notes (Signed)
This encounter was created in error - please disregard.

## 2013-10-18 ENCOUNTER — Encounter: Payer: Self-pay | Admitting: Gastroenterology

## 2014-02-27 ENCOUNTER — Other Ambulatory Visit: Payer: Self-pay | Admitting: Internal Medicine

## 2014-03-12 ENCOUNTER — Ambulatory Visit (INDEPENDENT_AMBULATORY_CARE_PROVIDER_SITE_OTHER): Payer: Medicare Other | Admitting: Internal Medicine

## 2014-03-12 ENCOUNTER — Encounter: Payer: Self-pay | Admitting: Internal Medicine

## 2014-03-12 VITALS — BP 116/74 | HR 81 | Ht 64.0 in | Wt 112.0 lb

## 2014-03-12 DIAGNOSIS — I10 Essential (primary) hypertension: Secondary | ICD-10-CM

## 2014-03-12 DIAGNOSIS — I495 Sick sinus syndrome: Secondary | ICD-10-CM

## 2014-03-12 DIAGNOSIS — I480 Paroxysmal atrial fibrillation: Secondary | ICD-10-CM

## 2014-03-12 LAB — MDC_IDC_ENUM_SESS_TYPE_INCLINIC
Brady Statistic AP VS Percent: 91 %
Brady Statistic AS VP Percent: 0 %
Lead Channel Pacing Threshold Amplitude: 0.5 V
Lead Channel Pacing Threshold Amplitude: 1.25 V
Lead Channel Pacing Threshold Pulse Width: 0.4 ms
Lead Channel Sensing Intrinsic Amplitude: 5.6 mV
Lead Channel Setting Pacing Pulse Width: 0.4 ms
Lead Channel Setting Sensing Sensitivity: 4 mV
MDC IDC MSMT BATTERY IMPEDANCE: 299 Ohm
MDC IDC MSMT BATTERY REMAINING LONGEVITY: 112 mo
MDC IDC MSMT BATTERY VOLTAGE: 2.8 V
MDC IDC MSMT LEADCHNL RA IMPEDANCE VALUE: 449 Ohm
MDC IDC MSMT LEADCHNL RA SENSING INTR AMPL: 2.8 mV
MDC IDC MSMT LEADCHNL RV IMPEDANCE VALUE: 571 Ohm
MDC IDC MSMT LEADCHNL RV PACING THRESHOLD PULSEWIDTH: 0.4 ms
MDC IDC SESS DTM: 20151130121737
MDC IDC SET LEADCHNL RA PACING AMPLITUDE: 2 V
MDC IDC SET LEADCHNL RV PACING AMPLITUDE: 2.5 V
MDC IDC STAT BRADY AP VP PERCENT: 1 %
MDC IDC STAT BRADY AS VS PERCENT: 8 %

## 2014-03-12 NOTE — Patient Instructions (Signed)
Your physician recommends that you schedule a follow-up appointment in: 1 year with Dr. Johney FrameAllred. Your physician recommends that you schedule a follow-up appointment in: [facility]. You will receive a reminder letter in the mail in about  4months reminding you to call and schedule your appointment. If you don't receive this letter, please contact our office. Your physician recommends that you continue on your current medications as directed. Please refer to the Current Medication list given to you today.

## 2014-03-14 NOTE — Progress Notes (Signed)
PCP: Ignatius SpeckingVYAS,DHRUV B., MD  Anna Sutton is a 73 y.o. female who presents today for routine electrophysiology followup. She has advanced dementia. Today, she denies symptoms of palpitations, chest pain, shortness of breath,  lower extremity edema, or dizziness, presyncope.  She did have recent syncope for which she was evaluated at the hospital and diagnosed with dehydration/ hyponatremia.  She has scheduled follow-up with Dr Sherril CroonVyas. The patient is otherwise without complaint today.   Past Medical History  Diagnosis Date  . Paroxysmal atrial fibrillation   . Hyperlipidemia   . Hypertension   . Sick sinus syndrome     s/p PPM (MDT)  . Pacemaker    Past Surgical History  Procedure Laterality Date  . Pacemaker placement  12/13/08    MDT Adapta L implanted by Dr Johney FrameAllred    Current Outpatient Prescriptions  Medication Sig Dispense Refill  . atenolol (TENORMIN) 100 MG tablet Take 1 tablet (100 mg total) by mouth daily. 30 tablet 6  . CARTIA XT 120 MG 24 hr capsule TAKE 1 CAPSULE BY MOUTH ONCE A DAY. 30 capsule 6  . donepezil (ARICEPT) 10 MG tablet Take 10 mg by mouth at bedtime as needed.      . Multiple Vitamin (MULTIVITAMIN) tablet Take 1 tablet by mouth daily.      . Rivaroxaban (XARELTO) 15 MG TABS tablet Take 15 mg by mouth daily.     No current facility-administered medications for this visit.  Pt is unable to confirm medicines or doses today.  She will call us with this information  Physical Exam: Filed Vitals:   03/12/14 1200  BP: 116/74  Pulse: 81  Height: 5\' 4"  (1.626 m)  Weight: 112 lb (50.803 kg)  SpO2: 99%    GEN- The patient is well appearing, alert but very confused  Head- normocephalic, atraumatic Eyes-  Sclera clear, conjunctiva pink Ears- hearing intact Oropharynx- clear Lungs- Clear to ausculation bilaterally, normal work of breathing Chest- pacemaker pocket is well healed Heart- irregular rhythm GI- soft, NT, ND, + BS Extremities- no clubbing, cyanosis, or  edema  Pacemaker interrogation- reviewed in detail today,  See PACEART report  Assessment and Plan:  Problem # 1: tachycardia/ bradycardia Normal pacemaker function  no changes today   Problem # 2: ATRIAL FIBRILLATION   She takes xarelto 15mg  daily.  Dr Sherril CroonVyas is following renal function.   V rates appear well controlled.  Problem # 3: HYPERTENSION  Stable  Follow-up with PCP regarding recently diagnosed hyponatremia and dehydration.  Return to see Belenda CruiseKristin in 6 months I will see in a year

## 2014-05-02 ENCOUNTER — Other Ambulatory Visit: Payer: Self-pay | Admitting: Internal Medicine

## 2014-06-07 ENCOUNTER — Other Ambulatory Visit: Payer: Self-pay | Admitting: Internal Medicine

## 2014-07-28 ENCOUNTER — Inpatient Hospital Stay (HOSPITAL_COMMUNITY): Payer: Medicare Other

## 2014-07-28 ENCOUNTER — Inpatient Hospital Stay (HOSPITAL_COMMUNITY)
Admission: EM | Admit: 2014-07-28 | Discharge: 2014-07-31 | DRG: 470 | Disposition: A | Discharge status: Skilled Nursing Facility | Payer: Medicare Other | Attending: Internal Medicine | Admitting: Internal Medicine

## 2014-07-28 ENCOUNTER — Emergency Department (HOSPITAL_COMMUNITY): Payer: Medicare Other

## 2014-07-28 ENCOUNTER — Encounter (HOSPITAL_COMMUNITY): Payer: Self-pay | Admitting: Emergency Medicine

## 2014-07-28 DIAGNOSIS — I495 Sick sinus syndrome: Secondary | ICD-10-CM | POA: Diagnosis present

## 2014-07-28 DIAGNOSIS — R296 Repeated falls: Secondary | ICD-10-CM | POA: Diagnosis present

## 2014-07-28 DIAGNOSIS — R55 Syncope and collapse: Secondary | ICD-10-CM

## 2014-07-28 DIAGNOSIS — R7401 Elevation of levels of liver transaminase levels: Secondary | ICD-10-CM | POA: Diagnosis present

## 2014-07-28 DIAGNOSIS — F0390 Unspecified dementia without behavioral disturbance: Secondary | ICD-10-CM | POA: Diagnosis present

## 2014-07-28 DIAGNOSIS — Z7901 Long term (current) use of anticoagulants: Secondary | ICD-10-CM

## 2014-07-28 DIAGNOSIS — S0011XA Contusion of right eyelid and periocular area, initial encounter: Secondary | ICD-10-CM

## 2014-07-28 DIAGNOSIS — R740 Nonspecific elevation of levels of transaminase and lactic acid dehydrogenase [LDH]: Secondary | ICD-10-CM

## 2014-07-28 DIAGNOSIS — I480 Paroxysmal atrial fibrillation: Secondary | ICD-10-CM | POA: Diagnosis present

## 2014-07-28 DIAGNOSIS — Z7982 Long term (current) use of aspirin: Secondary | ICD-10-CM

## 2014-07-28 DIAGNOSIS — S72001A Fracture of unspecified part of neck of right femur, initial encounter for closed fracture: Principal | ICD-10-CM | POA: Diagnosis present

## 2014-07-28 DIAGNOSIS — R569 Unspecified convulsions: Secondary | ICD-10-CM

## 2014-07-28 DIAGNOSIS — G459 Transient cerebral ischemic attack, unspecified: Secondary | ICD-10-CM | POA: Diagnosis present

## 2014-07-28 DIAGNOSIS — Z01810 Encounter for preprocedural cardiovascular examination: Secondary | ICD-10-CM | POA: Diagnosis not present

## 2014-07-28 DIAGNOSIS — Z950 Presence of cardiac pacemaker: Secondary | ICD-10-CM

## 2014-07-28 DIAGNOSIS — W19XXXA Unspecified fall, initial encounter: Secondary | ICD-10-CM | POA: Diagnosis present

## 2014-07-28 DIAGNOSIS — E860 Dehydration: Secondary | ICD-10-CM | POA: Diagnosis present

## 2014-07-28 DIAGNOSIS — I10 Essential (primary) hypertension: Secondary | ICD-10-CM | POA: Diagnosis not present

## 2014-07-28 DIAGNOSIS — M25569 Pain in unspecified knee: Secondary | ICD-10-CM

## 2014-07-28 DIAGNOSIS — E785 Hyperlipidemia, unspecified: Secondary | ICD-10-CM | POA: Diagnosis present

## 2014-07-28 DIAGNOSIS — M25551 Pain in right hip: Secondary | ICD-10-CM | POA: Diagnosis present

## 2014-07-28 DIAGNOSIS — Y92009 Unspecified place in unspecified non-institutional (private) residence as the place of occurrence of the external cause: Secondary | ICD-10-CM

## 2014-07-28 DIAGNOSIS — I4891 Unspecified atrial fibrillation: Secondary | ICD-10-CM | POA: Diagnosis present

## 2014-07-28 DIAGNOSIS — Z96641 Presence of right artificial hip joint: Secondary | ICD-10-CM

## 2014-07-28 LAB — CBC WITH DIFFERENTIAL/PLATELET
Basophils Absolute: 0 10*3/uL (ref 0.0–0.1)
Basophils Relative: 0 % (ref 0–1)
Eosinophils Absolute: 0 10*3/uL (ref 0.0–0.7)
Eosinophils Relative: 0 % (ref 0–5)
HEMATOCRIT: 37.2 % (ref 36.0–46.0)
Hemoglobin: 11.9 g/dL — ABNORMAL LOW (ref 12.0–15.0)
Lymphocytes Relative: 6 % — ABNORMAL LOW (ref 12–46)
Lymphs Abs: 0.7 10*3/uL (ref 0.7–4.0)
MCH: 29 pg (ref 26.0–34.0)
MCHC: 32 g/dL (ref 30.0–36.0)
MCV: 90.7 fL (ref 78.0–100.0)
Monocytes Absolute: 0.7 10*3/uL (ref 0.1–1.0)
Monocytes Relative: 6 % (ref 3–12)
NEUTROS ABS: 11.1 10*3/uL — AB (ref 1.7–7.7)
Neutrophils Relative %: 88 % — ABNORMAL HIGH (ref 43–77)
Platelets: 226 10*3/uL (ref 150–400)
RBC: 4.1 MIL/uL (ref 3.87–5.11)
RDW: 14 % (ref 11.5–15.5)
WBC: 12.6 10*3/uL — ABNORMAL HIGH (ref 4.0–10.5)

## 2014-07-28 LAB — URINE MICROSCOPIC-ADD ON

## 2014-07-28 LAB — HEPATITIS PANEL, ACUTE
HCV Ab: NEGATIVE
HEP A IGM: NONREACTIVE
HEP B S AG: NEGATIVE
Hep B C IgM: NONREACTIVE

## 2014-07-28 LAB — COMPREHENSIVE METABOLIC PANEL
ALT: 45 U/L — ABNORMAL HIGH (ref 0–35)
ANION GAP: 14 (ref 5–15)
AST: 206 U/L — ABNORMAL HIGH (ref 0–37)
Albumin: 3.6 g/dL (ref 3.5–5.2)
Alkaline Phosphatase: 108 U/L (ref 39–117)
BUN: 12 mg/dL (ref 6–23)
CO2: 26 mmol/L (ref 19–32)
Calcium: 8.9 mg/dL (ref 8.4–10.5)
Chloride: 100 mmol/L (ref 96–112)
Creatinine, Ser: 0.91 mg/dL (ref 0.50–1.10)
GFR calc non Af Amer: 60 mL/min — ABNORMAL LOW (ref >90)
GFR, EST AFRICAN AMERICAN: 69 mL/min — AB (ref >90)
GLUCOSE: 171 mg/dL — AB (ref 70–99)
POTASSIUM: 3.8 mmol/L (ref 3.5–5.1)
SODIUM: 140 mmol/L (ref 135–145)
Total Bilirubin: 0.9 mg/dL (ref 0.3–1.2)
Total Protein: 6.9 g/dL (ref 6.0–8.3)

## 2014-07-28 LAB — URINALYSIS, ROUTINE W REFLEX MICROSCOPIC
Bilirubin Urine: NEGATIVE
Glucose, UA: NEGATIVE mg/dL
HGB URINE DIPSTICK: NEGATIVE
Ketones, ur: NEGATIVE mg/dL
NITRITE: NEGATIVE
PH: 7 (ref 5.0–8.0)
Protein, ur: NEGATIVE mg/dL
SPECIFIC GRAVITY, URINE: 1.005 (ref 1.005–1.030)
Urobilinogen, UA: 0.2 mg/dL (ref 0.0–1.0)

## 2014-07-28 LAB — I-STAT CG4 LACTIC ACID, ED: Lactic Acid, Venous: 2.25 mmol/L (ref 0.5–2.0)

## 2014-07-28 LAB — I-STAT TROPONIN, ED: Troponin i, poc: 0.01 ng/mL (ref 0.00–0.08)

## 2014-07-28 LAB — PROTIME-INR
INR: 1.36 (ref 0.00–1.49)
Prothrombin Time: 16.9 seconds — ABNORMAL HIGH (ref 11.6–15.2)

## 2014-07-28 LAB — TSH: TSH: 3.219 u[IU]/mL (ref 0.350–4.500)

## 2014-07-28 LAB — CK: CK TOTAL: 67 U/L (ref 7–177)

## 2014-07-28 MED ORDER — ATORVASTATIN CALCIUM 10 MG PO TABS
10.0000 mg | ORAL_TABLET | Freq: Every day | ORAL | Status: DC
  Administered 2014-07-28: 10 mg via ORAL
  Filled 2014-07-28: qty 1

## 2014-07-28 MED ORDER — DILTIAZEM HCL ER COATED BEADS 120 MG PO CP24: 120.0000 mg | ORAL_CAPSULE | Freq: Every day | ORAL | Status: DC

## 2014-07-28 MED ORDER — ATENOLOL 50 MG PO TABS
100.0000 mg | ORAL_TABLET | Freq: Every day | ORAL | Status: DC
  Administered 2014-07-28: 100 mg via ORAL
  Filled 2014-07-28: qty 2

## 2014-07-28 MED ORDER — HYDROCODONE-ACETAMINOPHEN 5-325 MG PO TABS
1.0000 | ORAL_TABLET | ORAL | Status: DC | PRN
  Administered 2014-07-28: 2 via ORAL
  Filled 2014-07-28: qty 2

## 2014-07-28 MED ORDER — ATENOLOL 12.5 MG HALF TABLET
12.5000 mg | ORAL_TABLET | Freq: Every day | ORAL | Status: DC
  Administered 2014-07-29 – 2014-07-31 (×3): 12.5 mg via ORAL
  Filled 2014-07-28 (×3): qty 1

## 2014-07-28 MED ORDER — MORPHINE SULFATE 2 MG/ML IJ SOLN: 2.0000 mg | INTRAMUSCULAR | Status: DC | PRN

## 2014-07-28 MED ORDER — DILTIAZEM HCL ER COATED BEADS 120 MG PO CP24
120.0000 mg | ORAL_CAPSULE | Freq: Every day | ORAL | Status: DC
  Administered 2014-07-28: 120 mg via ORAL
  Filled 2014-07-28 (×2): qty 1

## 2014-07-28 MED ORDER — ONE-DAILY MULTI VITAMINS PO TABS: 1.0000 | ORAL_TABLET | Freq: Every day | ORAL | Status: DC

## 2014-07-28 MED ORDER — MORPHINE SULFATE 4 MG/ML IJ SOLN
4.0000 mg | INTRAMUSCULAR | Status: AC | PRN
  Administered 2014-07-28 (×2): 4 mg via INTRAVENOUS
  Filled 2014-07-28 (×2): qty 1

## 2014-07-28 MED ORDER — DONEPEZIL HCL 10 MG PO TABS
10.0000 mg | ORAL_TABLET | Freq: Every day | ORAL | Status: DC
  Administered 2014-07-28 – 2014-07-31 (×4): 10 mg via ORAL
  Filled 2014-07-28 (×4): qty 1

## 2014-07-28 MED ORDER — SODIUM CHLORIDE 0.9 % IV SOLN
INTRAVENOUS | Status: DC
  Administered 2014-07-28: 13:00:00 via INTRAVENOUS

## 2014-07-28 MED ORDER — ADULT MULTIVITAMIN W/MINERALS CH
1.0000 | ORAL_TABLET | Freq: Every day | ORAL | Status: DC
  Administered 2014-07-28 – 2014-07-31 (×3): 1 via ORAL
  Filled 2014-07-28 (×3): qty 1

## 2014-07-28 MED ORDER — MORPHINE SULFATE 2 MG/ML IJ SOLN
1.0000 mg | INTRAMUSCULAR | Status: AC | PRN
  Administered 2014-07-29 (×3): 1 mg via INTRAVENOUS
  Filled 2014-07-28 (×3): qty 1

## 2014-07-28 NOTE — ED Notes (Addendum)
Per EMS pt fell hit head, loc. Pt noted to have shorten right leg and c/o R hip and leg pain.  Pt noted to have bruising to R eye with some yellow tenting. Multiple skin tears to arms. Pt is on blood thinner. Pt has pacemaker. Vitals: 121/59, 67HR, RR27, 96% Rm Air, 99.9 Temp. Pt has hx of dementia. Pt received 10 mg Morphine en route.

## 2014-07-28 NOTE — Consult Note (Signed)
Reason for Consult:RIGHT HIP FRACTURE Referring Physician:MCMANUS, K  Anna Sutton is an 74 y.o. female.  HPI: [address] FALL AT HOME THIS AM WITH A CAREGIVER UNABLE TO BEAR WEIGHT ON RIGHT LEG EMS CALLED. FAMILY REPORTS MECHANICAL FALL THIS PAST Thursday . FAMILY DENIES ANY LOC OR HEAD INJURY WITH EITHER FALL. STATES SHE GETS AROUND WELL WITHOUT ANY ASSISTIVE DEVICES LIVING AT HOME. HISTORY OF SYNCOPAL EPISODE ONE YEAR AGO PER SON WHO IS PRESENT BEDSIDE. FAMILY REPORTS PATIENT TO BE ON XARELTO , HISTORY OF A-FIB AND HAS A PACEMAKER.   Past Medical History  Diagnosis Date  . Paroxysmal atrial fibrillation   . Hyperlipidemia   . Hypertension   . Sick sinus syndrome     s/p PPM (MDT)  . Pacemaker     Past Surgical History  Procedure Laterality Date  . Pacemaker placement  12/13/08    MDT Adapta L implanted by Dr Rayann Heman    Family History  Problem Relation Age of Onset  . Heart failure      Social History:  reports that she has never smoked. She has never used smokeless tobacco. She reports that she does not drink alcohol or use illicit drugs.  Allergies: No Known Allergies  Medications: I have reviewed the patient's current medications.  Results for orders placed or performed during the hospital encounter of 07/28/14 (from the past 48 hour(s))  Urinalysis, Routine w reflex microscopic     Status: Abnormal   Collection Time: 07/28/14  7:40 AM  Result Value Ref Range   Color, Urine YELLOW YELLOW   APPearance CLEAR CLEAR   Specific Gravity, Urine 1.005 1.005 - 1.030   pH 7.0 5.0 - 8.0   Glucose, UA NEGATIVE NEGATIVE mg/dL   Hgb urine dipstick NEGATIVE NEGATIVE   Bilirubin Urine NEGATIVE NEGATIVE   Ketones, ur NEGATIVE NEGATIVE mg/dL   Protein, ur NEGATIVE NEGATIVE mg/dL   Urobilinogen, UA 0.2 0.0 - 1.0 mg/dL   Nitrite NEGATIVE NEGATIVE   Leukocytes, UA SMALL (A) NEGATIVE  Urine microscopic-add on     Status: None   Collection Time: 07/28/14  7:40 AM   Result Value Ref Range   Squamous Epithelial / LPF RARE RARE   WBC, UA 3-6 <3 WBC/hpf  Comprehensive metabolic panel     Status: Abnormal   Collection Time: 07/28/14  7:42 AM  Result Value Ref Range   Sodium 140 135 - 145 mmol/L   Potassium 3.8 3.5 - 5.1 mmol/L   Chloride 100 96 - 112 mmol/L   CO2 26 19 - 32 mmol/L   Glucose, Bld 171 (H) 70 - 99 mg/dL   BUN 12 6 - 23 mg/dL   Creatinine, Ser 0.91 0.50 - 1.10 mg/dL   Calcium 8.9 8.4 - 10.5 mg/dL   Total Protein 6.9 6.0 - 8.3 g/dL   Albumin 3.6 3.5 - 5.2 g/dL   AST 206 (H) 0 - 37 U/L   ALT 45 (H) 0 - 35 U/L   Alkaline Phosphatase 108 39 - 117 U/L   Total Bilirubin 0.9 0.3 - 1.2 mg/dL   GFR calc non Af Amer 60 (L) >90 mL/min   GFR calc Af Amer 69 (L) >90 mL/min    Comment: (NOTE) The eGFR has been calculated using the CKD EPI equation. This calculation has not been validated in all clinical situations. eGFR's persistently <90 mL/min signify possible Chronic Kidney Disease.    Anion gap 14 5 - 15  CBC with Differential  Status: Abnormal   Collection Time: 07/28/14  7:42 AM  Result Value Ref Range   WBC 12.6 (H) 4.0 - 10.5 K/uL   RBC 4.10 3.87 - 5.11 MIL/uL   Hemoglobin 11.9 (L) 12.0 - 15.0 g/dL   HCT 37.2 36.0 - 46.0 %   MCV 90.7 78.0 - 100.0 fL   MCH 29.0 26.0 - 34.0 pg   MCHC 32.0 30.0 - 36.0 g/dL   RDW 14.0 11.5 - 15.5 %   Platelets 226 150 - 400 K/uL   Neutrophils Relative % 88 (H) 43 - 77 %   Neutro Abs 11.1 (H) 1.7 - 7.7 K/uL   Lymphocytes Relative 6 (L) 12 - 46 %   Lymphs Abs 0.7 0.7 - 4.0 K/uL   Monocytes Relative 6 3 - 12 %   Monocytes Absolute 0.7 0.1 - 1.0 K/uL   Eosinophils Relative 0 0 - 5 %   Eosinophils Absolute 0.0 0.0 - 0.7 K/uL   Basophils Relative 0 0 - 1 %   Basophils Absolute 0.0 0.0 - 0.1 K/uL  Protime-INR     Status: Abnormal   Collection Time: 07/28/14  7:42 AM  Result Value Ref Range   Prothrombin Time 16.9 (H) 11.6 - 15.2 seconds   INR 1.36 0.00 - 1.49  CK     Status: None    Collection Time: 07/28/14  7:42 AM  Result Value Ref Range   Total CK 67 7 - 177 U/L  I-stat troponin, ED     Status: None   Collection Time: 07/28/14  7:56 AM  Result Value Ref Range   Troponin i, poc 0.01 0.00 - 0.08 ng/mL   Comment 3            Comment: Due to the release kinetics of cTnI, a negative result within the first hours of the onset of symptoms does not rule out myocardial infarction with certainty. If myocardial infarction is still suspected, repeat the test at appropriate intervals.   I-Stat CG4 Lactic Acid, ED     Status: Abnormal   Collection Time: 07/28/14  7:58 AM  Result Value Ref Range   Lactic Acid, Venous 2.25 (HH) 0.5 - 2.0 mmol/L   Comment NOTIFIED PHYSICIAN     Dg Chest 1 View  07/28/2014   CLINICAL DATA:  Pt fell this morning, seems to be very confused. She has right hip pain, and her foot is rotated inward. No known chest complaints Hx of paroxysmal atrail fibrillation, sick sinus syndrome, HTN  EXAM: CHEST  1 VIEW  COMPARISON:  12/14/2008  FINDINGS: Cardiac silhouette mildly enlarged. Normal mediastinal and hilar contours.  Clear lungs.  No pleural effusion or pneumothorax.  Left anterior chest wall sequential pacemaker is stable and well positioned.  Bony thorax is intact.  IMPRESSION: No active disease.   Electronically Signed   By: Lajean Manes M.D.   On: 07/28/2014 08:45   Ct Head Wo Contrast  07/28/2014   CLINICAL DATA:  RECENT FALL, PT. WITH RIGHT ORBITAL HEMATOMA-GREENISH/PURPLEISH IN COLOR, PT. UNABLE TO MOVE WITHOUT ASSISTANCE,  EXAM: CT HEAD WITHOUT CONTRAST  CT MAXILLOFACIAL WITHOUT CONTRAST  CT CERVICAL SPINE WITHOUT CONTRAST  TECHNIQUE: Multidetector CT imaging of the head, cervical spine, and maxillofacial structures were performed using the standard protocol without intravenous contrast. Multiplanar CT image reconstructions of the cervical spine and maxillofacial structures were also generated.  COMPARISON:  03/06/2014  FINDINGS: CT HEAD FINDINGS   Ventricles normal configuration. There is ventricular and sulcal enlargement reflecting  mild to moderate atrophy. No hydrocephalus.  There are no parenchymal masses or mass effect. There is no evidence of a cortical infarct. Mild periventricular white matter hypoattenuation is noted, stable, consistent with chronic small-vessel ischemic change.  There are no extra-axial masses or abnormal fluid collections.  There is no intracranial hemorrhage.  No skull fracture.  CT MAXILLOFACIAL FINDINGS  No fracture. Small foci of mucosal thickening in each maxillary sinus. Sinuses otherwise clear. Clear mastoid air cells and middle ear cavities.  Globes orbits are unremarkable. No soft tissue masses. Mild right lateral periorbital soft tissue edema. Degenerative changes are noted of both temporomandibular joints.  CT CERVICAL SPINE FINDINGS  No fracture. No spondylolisthesis. There are mild disc degenerative changes from C3-C4 through C5-C6 with endplate spurring and mild disc bulging by without significant loss disc height. There is no significant central stenosis or neural foraminal narrowing.  Soft tissues show carotid vascular calcifications but are otherwise unremarkable. Lung apices are clear.  IMPRESSION: HEAD CT:  No acute intracranial abnormalities.  No skull fracture.  MAXILLOFACIAL CT:  No fracture.  CERVICAL CT:  No fracture or acute finding.   Electronically Signed   By: Lajean Manes M.D.   On: 07/28/2014 08:35   Ct Cervical Spine Wo Contrast  07/28/2014   CLINICAL DATA:  RECENT FALL, PT. WITH RIGHT ORBITAL HEMATOMA-GREENISH/PURPLEISH IN COLOR, PT. UNABLE TO MOVE WITHOUT ASSISTANCE,  EXAM: CT HEAD WITHOUT CONTRAST  CT MAXILLOFACIAL WITHOUT CONTRAST  CT CERVICAL SPINE WITHOUT CONTRAST  TECHNIQUE: Multidetector CT imaging of the head, cervical spine, and maxillofacial structures were performed using the standard protocol without intravenous contrast. Multiplanar CT image reconstructions of the cervical spine and  maxillofacial structures were also generated.  COMPARISON:  03/06/2014  FINDINGS: CT HEAD FINDINGS  Ventricles normal configuration. There is ventricular and sulcal enlargement reflecting mild to moderate atrophy. No hydrocephalus.  There are no parenchymal masses or mass effect. There is no evidence of a cortical infarct. Mild periventricular white matter hypoattenuation is noted, stable, consistent with chronic small-vessel ischemic change.  There are no extra-axial masses or abnormal fluid collections.  There is no intracranial hemorrhage.  No skull fracture.  CT MAXILLOFACIAL FINDINGS  No fracture. Small foci of mucosal thickening in each maxillary sinus. Sinuses otherwise clear. Clear mastoid air cells and middle ear cavities.  Globes orbits are unremarkable. No soft tissue masses. Mild right lateral periorbital soft tissue edema. Degenerative changes are noted of both temporomandibular joints.  CT CERVICAL SPINE FINDINGS  No fracture. No spondylolisthesis. There are mild disc degenerative changes from C3-C4 through C5-C6 with endplate spurring and mild disc bulging by without significant loss disc height. There is no significant central stenosis or neural foraminal narrowing.  Soft tissues show carotid vascular calcifications but are otherwise unremarkable. Lung apices are clear.  IMPRESSION: HEAD CT:  No acute intracranial abnormalities.  No skull fracture.  MAXILLOFACIAL CT:  No fracture.  CERVICAL CT:  No fracture or acute finding.   Electronically Signed   By: Lajean Manes M.D.   On: 07/28/2014 08:35   Dg Hip Unilat With Pelvis 2-3 Views Right  07/28/2014   CLINICAL DATA:  Pt fell this morning, seems to be very confused. She has right hip pain, and her foot is rotated inward. No known chest complaints Hx of paroxysmal atrail fibrillation, sick sinus syndrome, HTN  EXAM: RIGHT HIP (WITH PELVIS) 2-3 VIEWS  COMPARISON:  None.  FINDINGS: There is a fracture of the right femoral neck. Fracture is mid  cervical. There is a single comminuted fracture fragment along the superior margin of fracture. The shaft fracture component has migrated superiorly, displacing 2.5 cm in relation to femoral head neck component. There is mild varus angulation.  No other fractures. Hip joints are normally aligned. Bones are diffusely demineralized.  IMPRESSION: Displaced right femoral neck fracture as described.   Electronically Signed   By: Lajean Manes M.D.   On: 07/28/2014 08:46   Ct Maxillofacial Wo Cm  07/28/2014   CLINICAL DATA:  RECENT FALL, PT. WITH RIGHT ORBITAL HEMATOMA-GREENISH/PURPLEISH IN COLOR, PT. UNABLE TO MOVE WITHOUT ASSISTANCE,  EXAM: CT HEAD WITHOUT CONTRAST  CT MAXILLOFACIAL WITHOUT CONTRAST  CT CERVICAL SPINE WITHOUT CONTRAST  TECHNIQUE: Multidetector CT imaging of the head, cervical spine, and maxillofacial structures were performed using the standard protocol without intravenous contrast. Multiplanar CT image reconstructions of the cervical spine and maxillofacial structures were also generated.  COMPARISON:  03/06/2014  FINDINGS: CT HEAD FINDINGS  Ventricles normal configuration. There is ventricular and sulcal enlargement reflecting mild to moderate atrophy. No hydrocephalus.  There are no parenchymal masses or mass effect. There is no evidence of a cortical infarct. Mild periventricular white matter hypoattenuation is noted, stable, consistent with chronic small-vessel ischemic change.  There are no extra-axial masses or abnormal fluid collections.  There is no intracranial hemorrhage.  No skull fracture.  CT MAXILLOFACIAL FINDINGS  No fracture. Small foci of mucosal thickening in each maxillary sinus. Sinuses otherwise clear. Clear mastoid air cells and middle ear cavities.  Globes orbits are unremarkable. No soft tissue masses. Mild right lateral periorbital soft tissue edema. Degenerative changes are noted of both temporomandibular joints.  CT CERVICAL SPINE FINDINGS  No fracture. No  spondylolisthesis. There are mild disc degenerative changes from C3-C4 through C5-C6 with endplate spurring and mild disc bulging by without significant loss disc height. There is no significant central stenosis or neural foraminal narrowing.  Soft tissues show carotid vascular calcifications but are otherwise unremarkable. Lung apices are clear.  IMPRESSION: HEAD CT:  No acute intracranial abnormalities.  No skull fracture.  MAXILLOFACIAL CT:  No fracture.  CERVICAL CT:  No fracture or acute finding.   Electronically Signed   By: Lajean Manes M.D.   On: 07/28/2014 08:35    Review of Systems  Cardiovascular:       PAROXSYMAL ATRIAL FIBRILLATION SICK SINUS SYNDROME PACEMAKER HTN HISTORY OF SYNCOPAL EPISODE    Musculoskeletal: Positive for falls.       RIGHT HIP PAIN S/P FALL  Psychiatric/Behavioral:       DEMENTIA    Blood pressure 141/70, pulse 63, temperature 98.4 F (36.9 C), temperature source Rectal, resp. rate 18, SpO2 96 %. Physical Exam  Constitutional:  FRAIL APPEARING ELDERLY FEMALE  HENT:  Head: Normocephalic.  ECCHYMOSIS RIGHT ORBIT  Eyes: EOM are normal.  Cardiovascular: Normal rate.   Respiratory: Effort normal.  GI: Soft.  Musculoskeletal:  RIGHT LEG SHORTENED AND EXTERNALLY ROTATED. TENDERNESS RIGHT KNEE LATERAL JOINT LINE. RIGHT KNEE NO EFFUSION OR SKIN BREAKDOWN. LEFT LEG NON TENDER NO GROSS DEFORMITIES.  GENTLE RANGE OF MOTION RIGHT HIP FLUID MOTION WITHOUT PAIN. ABLE TO DORSIFLEX AND PLANTAR FLEX.  UPPER EXTREMITIES WITHOUT OBVIOUS DEFORMITIES  OR TENDERNESS. RIGHT POSTERIOR ELBOW WITH AN AREA SCABBED OVER NO SIGNS OF INFECTION.  Neurological:  FOLLOWS COMMANDS AND WILL ANSWER NO AND YES BUT DOES NOT ANSWER IN FULL SENTENCES.   Skin: Skin is warm and dry.    Assessment/Plan: RIGHT HIP DISPLACED FEMORAL NECK FRACTURE WILL  NEED RIGHT HIP HEMIARTHROPLASTY FOR COMFORT AND QUALITY OF LIFE . SPOKE WITH SON ABOUT NEED FOR SURGERY . HOWEVER WILL NEED OPTIMIZATION  FROM MEDICAL STANDPOINT AND NEED TO BE OFF ANTICOAGS PRIOR TO SURGICAL INTERVENTION.   RIGHT KNEE PAIN WILL OBTAIN RADIOGRAPHS TO R/O ACUTE FRACTURE.  MAY EAT FROM ORTHOPAEDIC STANDPOINT   STRICT BED REST   RIGHT HEMIARTHROPLASTY IN [place] PA-C  07/28/2014, 9:32 AM

## 2014-07-28 NOTE — ED Notes (Signed)
Pt noted to have dried stool in underwear.

## 2014-07-28 NOTE — ED Notes (Signed)
MD aware of abnormal lactic.

## 2014-07-28 NOTE — ED Notes (Signed)
Lab results given to Dr.McManus. 

## 2014-07-28 NOTE — Progress Notes (Signed)
Anna CrankerAnita Sutton and myself performed the order for the RN stroke swallow screen. Patient had no issues with swallowing and was placed on vegetarian diet. Per family, patient had no prior issues with swallowing prior to admission. Will continue to follow.

## 2014-07-28 NOTE — ED Notes (Signed)
Charge nurse at bedside interrogate pacemaker.

## 2014-07-28 NOTE — ED Provider Notes (Signed)
CSN: [id number]     Arrival date & time 07/28/14  0714 History   First MD Initiated Contact with Patient 07/28/14 [phone]     Chief Complaint  Patient presents with  . Fall      Patient is a 74 y.o. female presenting with fall. The history is provided by the patient, the EMS personnel and a caregiver. The history is limited by the condition of the patient (Hx dementia).  Fall  Pt was seen at 0720. Per EMS, pt's family and pt report: States pt s/p fall 2 days ago and again last night. Pt states she "passed out" last night."  Pt c/o "hitting my head," right hip pain and "bruise" to her right periorbital area. Pt with dried stool on legs and underwear. Pt has significant hx of dementia.    Past Medical History  Diagnosis Date  . Paroxysmal atrial fibrillation   . Hyperlipidemia   . Hypertension   . Sick sinus syndrome     s/p PPM (MDT)  . Pacemaker    Past Surgical History  Procedure Laterality Date  . Pacemaker placement  12/13/08    MDT Adapta L implanted by Dr Johney Frame   Family History  Problem Relation Age of Onset  . Heart failure     History  Substance Use Topics  . Smoking status: Never Smoker   . Smokeless tobacco: Never Used  . Alcohol Use: No    Review of Systems  Unable to perform ROS: Dementia      Allergies  Review of patient's allergies indicates no known allergies.  Home Medications   Prior to Admission medications   Medication Sig Start Date End Date Taking? Authorizing Provider  atenolol (TENORMIN) 100 MG tablet Take 1 tablet (100 mg total) by mouth daily. 12/31/11   Hillis Range, MD  CARTIA XT 120 MG 24 hr capsule TAKE 1 CAPSULE BY MOUTH ONCE A DAY. 02/28/14   Hillis Range, MD  CARTIA XT 120 MG 24 hr capsule TAKE 1 CAPSULE BY MOUTH ONCE A DAY. 06/07/14   Hillis Range, MD  donepezil (ARICEPT) 10 MG tablet Take 10 mg by mouth at bedtime as needed.      Historical Provider, MD  Multiple Vitamin (MULTIVITAMIN) tablet Take 1 tablet by mouth daily.       Historical Provider, MD  Rivaroxaban (XARELTO) 15 MG TABS tablet Take 15 mg by mouth daily.    Historical Provider, MD   BP 116/39 mmHg  Pulse 112  Temp(Src) 98.4 F (36.9 C) (Rectal)  Resp 18  SpO2 95% Physical Exam  0725; Physical examination:  Nursing notes reviewed; Vital signs and O2 SAT reviewed;  Constitutional: Well developed, Well nourished, In no acute distress; Head:  Normocephalic, +right periorbital ecchymosis in various stages of healing. No open wounds, no edema.; Eyes: EOMI, PERRL, No scleral icterus. No conjunctival injection bilat.; ENMT: Mouth and pharynx normal, Mucous membranes dry.;; Neck: Supple, Full range of motion, No lymphadenopathy; Cardiovascular: Tachycardic rate and rhythm, No gallop; Respiratory: Breath sounds clear & equal bilaterally, No wheezes.  Speaking full sentences with ease, Normal respiratory effort/excursion; Chest: Nontender, Movement normal; Abdomen: Soft, Nontender, Nondistended, Normal bowel sounds; Genitourinary: No CVA tenderness; Extremities: Pulses normal, pelvis stable. +right hip tenderness to palp, with RLE shortened and externally rotated. No edema, no ecchymosis. NT right knee/ankle/foot with spontaneous and NT ROM. No calf edema or asymmetry.; Neuro: Awake, alert, confused per hx dementia. No facial droop. Speech clear. +right hip decreased ROM, otherwise moves all extremities  spontaneously and to command without apparent gross focal motor deficits.; Skin: Color normal, Warm, Dry.    ED Course  Procedures     EKG Interpretation   Date/Time:  Saturday July 28 2014 07:36:09 EDT Ventricular Rate:  66 PR Interval:  199 QRS Duration: 91 QT Interval:  432 QTC Calculation: 453 R Axis:   60 Text Interpretation:  Sinus rhythm Baseline wander When compared with ECG  of 12/13/2008 No significant change was found Confirmed by [facility]  MD,  Nicholos Johns [phone]) on 07/28/2014 8:19:56 AM      MDM  MDM Reviewed: previous chart, nursing note and  vitals Reviewed previous: labs and ECG Interpretation: labs, ECG, x-ray and CT scan     Results for orders placed or performed during the hospital encounter of 07/28/14  Comprehensive metabolic panel  Result Value Ref Range   Sodium 140 135 - 145 mmol/L   Potassium 3.8 3.5 - 5.1 mmol/L   Chloride 100 96 - 112 mmol/L   CO2 26 19 - 32 mmol/L   Glucose, Bld 171 (H) 70 - 99 mg/dL   BUN 12 6 - 23 mg/dL   Creatinine, Ser 6.04 0.50 - 1.10 mg/dL   Calcium 8.9 8.4 - 54.0 mg/dL   Total Protein 6.9 6.0 - 8.3 g/dL   Albumin 3.6 3.5 - 5.2 g/dL   AST 981 (H) 0 - 37 U/L   ALT 45 (H) 0 - 35 U/L   Alkaline Phosphatase 108 39 - 117 U/L   Total Bilirubin 0.9 0.3 - 1.2 mg/dL   GFR calc non Af Amer 60 (L) >90 mL/min   GFR calc Af Amer 69 (L) >90 mL/min   Anion gap 14 5 - 15  CBC with Differential  Result Value Ref Range   WBC 12.6 (H) 4.0 - 10.5 K/uL   RBC 4.10 3.87 - 5.11 MIL/uL   Hemoglobin 11.9 (L) 12.0 - 15.0 g/dL   HCT 19.1 47.8 - 29.5 %   MCV 90.7 78.0 - 100.0 fL   MCH 29.0 26.0 - 34.0 pg   MCHC 32.0 30.0 - 36.0 g/dL   RDW 62.1 30.8 - 65.7 %   Platelets 226 150 - 400 K/uL   Neutrophils Relative % 88 (H) 43 - 77 %   Neutro Abs 11.1 (H) 1.7 - 7.7 K/uL   Lymphocytes Relative 6 (L) 12 - 46 %   Lymphs Abs 0.7 0.7 - 4.0 K/uL   Monocytes Relative 6 3 - 12 %   Monocytes Absolute 0.7 0.1 - 1.0 K/uL   Eosinophils Relative 0 0 - 5 %   Eosinophils Absolute 0.0 0.0 - 0.7 K/uL   Basophils Relative 0 0 - 1 %   Basophils Absolute 0.0 0.0 - 0.1 K/uL  Protime-INR  Result Value Ref Range   Prothrombin Time 16.9 (H) 11.6 - 15.2 seconds   INR 1.36 0.00 - 1.49  Urinalysis, Routine w reflex microscopic  Result Value Ref Range   Color, Urine YELLOW YELLOW   APPearance CLEAR CLEAR   Specific Gravity, Urine 1.005 1.005 - 1.030   pH 7.0 5.0 - 8.0   Glucose, UA NEGATIVE NEGATIVE mg/dL   Hgb urine dipstick NEGATIVE NEGATIVE   Bilirubin Urine NEGATIVE NEGATIVE   Ketones, ur NEGATIVE NEGATIVE mg/dL    Protein, ur NEGATIVE NEGATIVE mg/dL   Urobilinogen, UA 0.2 0.0 - 1.0 mg/dL   Nitrite NEGATIVE NEGATIVE   Leukocytes, UA SMALL (A) NEGATIVE  CK  Result Value Ref Range   Total CK  67 7 - 177 U/L  Urine microscopic-add on  Result Value Ref Range   Squamous Epithelial / LPF RARE RARE   WBC, UA 3-6 <3 WBC/hpf  I-Stat CG4 Lactic Acid, ED  Result Value Ref Range   Lactic Acid, Venous 2.25 (HH) 0.5 - 2.0 mmol/L   Comment NOTIFIED PHYSICIAN   I-stat troponin, ED  Result Value Ref Range   Troponin i, poc 0.01 0.00 - 0.08 ng/mL   Comment 3           Dg Chest 1 View 07/28/2014   CLINICAL DATA:  Pt fell this morning, seems to be very confused. She has right hip pain, and her foot is rotated inward. No known chest complaints Hx of paroxysmal atrail fibrillation, sick sinus syndrome, HTN  EXAM: CHEST  1 VIEW  COMPARISON:  12/14/2008  FINDINGS: Cardiac silhouette mildly enlarged. Normal mediastinal and hilar contours.  Clear lungs.  No pleural effusion or pneumothorax.  Left anterior chest wall sequential pacemaker is stable and well positioned.  Bony thorax is intact.  IMPRESSION: No active disease.   Electronically Signed   By: Amie Portland M.D.   On: 07/28/2014 08:45   Ct Head Wo Contrast 07/28/2014   CLINICAL DATA:  RECENT FALL, PT. WITH RIGHT ORBITAL HEMATOMA-GREENISH/PURPLEISH IN COLOR, PT. UNABLE TO MOVE WITHOUT ASSISTANCE,  EXAM: CT HEAD WITHOUT CONTRAST  CT MAXILLOFACIAL WITHOUT CONTRAST  CT CERVICAL SPINE WITHOUT CONTRAST  TECHNIQUE: Multidetector CT imaging of the head, cervical spine, and maxillofacial structures were performed using the standard protocol without intravenous contrast. Multiplanar CT image reconstructions of the cervical spine and maxillofacial structures were also generated.  COMPARISON:  03/06/2014  FINDINGS: CT HEAD FINDINGS  Ventricles normal configuration. There is ventricular and sulcal enlargement reflecting mild to moderate atrophy. No hydrocephalus.  There are no  parenchymal masses or mass effect. There is no evidence of a cortical infarct. Mild periventricular white matter hypoattenuation is noted, stable, consistent with chronic small-vessel ischemic change.  There are no extra-axial masses or abnormal fluid collections.  There is no intracranial hemorrhage.  No skull fracture.  CT MAXILLOFACIAL FINDINGS  No fracture. Small foci of mucosal thickening in each maxillary sinus. Sinuses otherwise clear. Clear mastoid air cells and middle ear cavities.  Globes orbits are unremarkable. No soft tissue masses. Mild right lateral periorbital soft tissue edema. Degenerative changes are noted of both temporomandibular joints.  CT CERVICAL SPINE FINDINGS  No fracture. No spondylolisthesis. There are mild disc degenerative changes from C3-C4 through C5-C6 with endplate spurring and mild disc bulging by without significant loss disc height. There is no significant central stenosis or neural foraminal narrowing.  Soft tissues show carotid vascular calcifications but are otherwise unremarkable. Lung apices are clear.  IMPRESSION: HEAD CT:  No acute intracranial abnormalities.  No skull fracture.  MAXILLOFACIAL CT:  No fracture.  CERVICAL CT:  No fracture or acute finding.   Electronically Signed   By: Amie Portland M.D.   On: 07/28/2014 08:35   Ct Cervical Spine Wo Contrast 07/28/2014   CLINICAL DATA:  RECENT FALL, PT. WITH RIGHT ORBITAL HEMATOMA-GREENISH/PURPLEISH IN COLOR, PT. UNABLE TO MOVE WITHOUT ASSISTANCE,  EXAM: CT HEAD WITHOUT CONTRAST  CT MAXILLOFACIAL WITHOUT CONTRAST  CT CERVICAL SPINE WITHOUT CONTRAST  TECHNIQUE: Multidetector CT imaging of the head, cervical spine, and maxillofacial structures were performed using the standard protocol without intravenous contrast. Multiplanar CT image reconstructions of the cervical spine and maxillofacial structures were also generated.  COMPARISON:  03/06/2014  FINDINGS: CT  HEAD FINDINGS  Ventricles normal configuration. There is  ventricular and sulcal enlargement reflecting mild to moderate atrophy. No hydrocephalus.  There are no parenchymal masses or mass effect. There is no evidence of a cortical infarct. Mild periventricular white matter hypoattenuation is noted, stable, consistent with chronic small-vessel ischemic change.  There are no extra-axial masses or abnormal fluid collections.  There is no intracranial hemorrhage.  No skull fracture.  CT MAXILLOFACIAL FINDINGS  No fracture. Small foci of mucosal thickening in each maxillary sinus. Sinuses otherwise clear. Clear mastoid air cells and middle ear cavities.  Globes orbits are unremarkable. No soft tissue masses. Mild right lateral periorbital soft tissue edema. Degenerative changes are noted of both temporomandibular joints.  CT CERVICAL SPINE FINDINGS  No fracture. No spondylolisthesis. There are mild disc degenerative changes from C3-C4 through C5-C6 with endplate spurring and mild disc bulging by without significant loss disc height. There is no significant central stenosis or neural foraminal narrowing.  Soft tissues show carotid vascular calcifications but are otherwise unremarkable. Lung apices are clear.  IMPRESSION: HEAD CT:  No acute intracranial abnormalities.  No skull fracture.  MAXILLOFACIAL CT:  No fracture.  CERVICAL CT:  No fracture or acute finding.   Electronically Signed   By: Amie Portland M.D.   On: 07/28/2014 08:35   Dg Hip Unilat With Pelvis 2-3 Views Right 07/28/2014   CLINICAL DATA:  Pt fell this morning, seems to be very confused. She has right hip pain, and her foot is rotated inward. No known chest complaints Hx of paroxysmal atrail fibrillation, sick sinus syndrome, HTN  EXAM: RIGHT HIP (WITH PELVIS) 2-3 VIEWS  COMPARISON:  None.  FINDINGS: There is a fracture of the right femoral neck. Fracture is mid cervical. There is a single comminuted fracture fragment along the superior margin of fracture. The shaft fracture component has migrated superiorly,  displacing 2.5 cm in relation to femoral head neck component. There is mild varus angulation.  No other fractures. Hip joints are normally aligned. Bones are diffusely demineralized.  IMPRESSION: Displaced right femoral neck fracture as described.   Electronically Signed   By: Amie Portland M.D.   On: 07/28/2014 08:46   Ct Maxillofacial Wo Cm 07/28/2014   CLINICAL DATA:  RECENT FALL, PT. WITH RIGHT ORBITAL HEMATOMA-GREENISH/PURPLEISH IN COLOR, PT. UNABLE TO MOVE WITHOUT ASSISTANCE,  EXAM: CT HEAD WITHOUT CONTRAST  CT MAXILLOFACIAL WITHOUT CONTRAST  CT CERVICAL SPINE WITHOUT CONTRAST  TECHNIQUE: Multidetector CT imaging of the head, cervical spine, and maxillofacial structures were performed using the standard protocol without intravenous contrast. Multiplanar CT image reconstructions of the cervical spine and maxillofacial structures were also generated.  COMPARISON:  03/06/2014  FINDINGS: CT HEAD FINDINGS  Ventricles normal configuration. There is ventricular and sulcal enlargement reflecting mild to moderate atrophy. No hydrocephalus.  There are no parenchymal masses or mass effect. There is no evidence of a cortical infarct. Mild periventricular white matter hypoattenuation is noted, stable, consistent with chronic small-vessel ischemic change.  There are no extra-axial masses or abnormal fluid collections.  There is no intracranial hemorrhage.  No skull fracture.  CT MAXILLOFACIAL FINDINGS  No fracture. Small foci of mucosal thickening in each maxillary sinus. Sinuses otherwise clear. Clear mastoid air cells and middle ear cavities.  Globes orbits are unremarkable. No soft tissue masses. Mild right lateral periorbital soft tissue edema. Degenerative changes are noted of both temporomandibular joints.  CT CERVICAL SPINE FINDINGS  No fracture. No spondylolisthesis. There are mild disc degenerative changes from C3-C4  through C5-C6 with endplate spurring and mild disc bulging by without significant loss disc  height. There is no significant central stenosis or neural foraminal narrowing.  Soft tissues show carotid vascular calcifications but are otherwise unremarkable. Lung apices are clear.  IMPRESSION: HEAD CT:  No acute intracranial abnormalities.  No skull fracture.  MAXILLOFACIAL CT:  No fracture.  CERVICAL CT:  No fracture or acute finding.   Electronically Signed   By: Amie Portlandavid  Ormond M.D.   On: 07/28/2014 08:35    0905:  +right hip fx. Pacemaker interrogated: most recent event was high atrial rate on 07/24/14.  T/C to Ortho Dr. Magnus IvanBlackman, case discussed, including:  HPI, pertinent PM/SHx, VS/PE, dx testing, ED course and treatment:  Agreeable to consult, states will not take pt to OR today d/t xarelto. [facility], case discussed, including:  HPI, pertinent PM/SHx, VS/PE, dx testing, ED course and treatment:  Agreeable to admit, requests they will come to ED for evaluation to admit.    Samuel JesterKathleen Rykker Coviello, DO 07/30/14 [id number]

## 2014-07-28 NOTE — ED Notes (Signed)
Pt family sts she is vegetarian

## 2014-07-28 NOTE — H&P (Signed)
Date: 07/28/2014               Patient Name:  Anna Sutton MRN: [id number]  DOB: [birth year] Age / Sex: 74 y.o., female   PCP: Ignatius Specking, MD         Medical Service: Internal Medicine Teaching Service         Attending Physician: Dr. Aletta Edouard, MD    First Contact: Dr. Farley Ly [phone]  Second Contact: Dr. Boykin Peek Pager: [phone]       After Hours (After 5p/  First Contact Pager: [phone]  weekends / holidays): Second Contact Pager: [phone]   Chief Complaint: fall  History of Present Illness: Anna Sutton is a 74 year old woman with advanced dementia, sick sinus syndrome s/p PPM, paroxysmal atrial fibrillation on xarelto, HTN, HL here for fall. Anna Sutton lives alone but has 24 hour home health aid supervision. History was provided by son as Anna Runquist has severe dementia. Her son says that, per the home health aid who witnessed event, at about 5:45 this morning she got up to use the bathroom, walked about four feet, and then fell. She landed on her R side and did not hit her head. She was allegedly awake the entire time. She was noted by EMS to have some dried stool on her legs but son is unsure about any incontinence. He says that she had another fall late Wednesday 4/13 or early Thursday 4/14 and hit her head as her aid caught her. She was not medically evaluated. He does not think there has been anything else unusual about his mother and she has not been complaining about anything such as fevers or dysuria. She currently says she has no complaints or pain but son notes she has had R hip pain after fall. Also of note, she was hospitalized about a year ago for syncope thought to be due to dehydration  In the ED, Anna Sutton was noted to have displaced R femoral neck fracture and ortho was consulted. She received morphine 4 mg iv once.  Meds: Current Facility-Administered Medications  Medication Dose Route Frequency Provider Last Rate Last Dose  . 0.9 %  sodium  chloride infusion   Intravenous Continuous Lorenda Hatchet, MD 75 mL/hr at 07/28/14 1321    . atenolol (TENORMIN) tablet 100 mg  100 mg Oral Daily Marrian Salvage, MD      . atorvastatin (LIPITOR) tablet 10 mg  10 mg Oral Daily Marrian Salvage, MD      . diltiazem (CARDIZEM CD) 24 hr capsule 120 mg  120 mg Oral Daily Marrian Salvage, MD      . donepezil (ARICEPT) tablet 10 mg  10 mg Oral Daily Marrian Salvage, MD      . HYDROcodone-acetaminophen (NORCO/VICODIN) 5-325 MG per tablet 1-2 tablet  1-2 tablet Oral Q4H PRN Kirtland Bouchard, PA-C      . multivitamin with minerals tablet 1 tablet  1 tablet Oral Daily Marrian Salvage, MD        Allergies: Allergies as of 07/28/2014  . (No Known Allergies)   Past Medical History  Diagnosis Date  . Paroxysmal atrial fibrillation   . Hyperlipidemia   . Hypertension   . Sick sinus syndrome     s/p PPM (MDT)  . Pacemaker    Past Surgical History  Procedure Laterality Date  . Pacemaker placement  12/13/08    MDT Adapta L implanted by Dr Johney Frame  Family History  Problem Relation Age of Onset  . Heart failure     History   Social History  . Marital Status: Widowed    Spouse Name: N/A  . Number of Children: N/A  . Years of Education: N/A   Occupational History  . Not on file.   Social History Main Topics  . Smoking status: Never Smoker   . Smokeless tobacco: Never Used  . Alcohol Use: No  . Drug Use: No  . Sexual Activity: Not on file   Other Topics Concern  . Not on file   Social History Narrative    Review of Systems: A comprehensive review of systems was negative except for: as noted above per HPI  Physical Exam: Blood pressure 150/54, pulse 63, temperature 99.6 F (37.6 C), temperature source Oral, resp. rate 17, SpO2 98 %.  Exam limited by patient's baseline dementia and pain on movement   Gen: No acute distress, well developed, well nourished HEENT: R periorbital ecchymosis, chipped teeth, PERRL, EOMI, sclerae  anicteric, dry mucous membranes Heart: Regular rate and rhythm, normal S1 S2, no murmurs, rubs, or gallops Lungs: Clear to auscultation bilaterally anteriorly (could not move due to pain), respirations unlabored Abd: Soft, non-tender, non-distended, + bowel sounds, no hepatosplenomegaly Ext: No signs of trauma on R leg, 2+ DP and PT pulses, no edema or cyanosis  Neuro: A&O x 1 (self), CN II-XII intact, strength 5/5 on finger squeeze, foot dorsiflexion and plantarflexion, sensation grossly intact, no Babinkski sign b/l  Lab results: Basic Metabolic Panel:  Recent Labs  81/19/14 0742  NA 140  K 3.8  CL 100  CO2 26  GLUCOSE 171*  BUN 12  CREATININE 0.91  CALCIUM 8.9   Liver Function Tests:  Recent Labs  07/28/14 0742  AST 206*  ALT 45*  ALKPHOS 108  BILITOT 0.9  PROT 6.9  ALBUMIN 3.6   CBC:  Recent Labs  07/28/14 0742  WBC 12.6*  NEUTROABS 11.1*  HGB 11.9*  HCT 37.2  MCV 90.7  PLT 226   Cardiac Enzymes:  Recent Labs  07/28/14 0742  CKTOTAL 67   Coagulation:  Recent Labs  07/28/14 0742  LABPROT 16.9*  INR 1.36   Urinalysis:  Recent Labs  07/28/14 0740  COLORURINE YELLOW  LABSPEC 1.005  PHURINE 7.0  GLUCOSEU NEGATIVE  HGBUR NEGATIVE  BILIRUBINUR NEGATIVE  KETONESUR NEGATIVE  PROTEINUR NEGATIVE  UROBILINOGEN 0.2  NITRITE NEGATIVE  LEUKOCYTESUR SMALL*   i-stat troponin 0.01 i stat lactic acid 2.25  Imaging results:  Dg Chest 1 View  07/28/2014   CLINICAL DATA:  Pt fell this morning, seems to be very confused. She has right hip pain, and her foot is rotated inward. No known chest complaints Hx of paroxysmal atrail fibrillation, sick sinus syndrome, HTN  EXAM: CHEST  1 VIEW  COMPARISON:  12/14/2008  FINDINGS: Cardiac silhouette mildly enlarged. Normal mediastinal and hilar contours.  Clear lungs.  No pleural effusion or pneumothorax.  Left anterior chest wall sequential pacemaker is stable and well positioned.  Bony thorax is intact.   IMPRESSION: No active disease.   Electronically Signed   By: Amie Portland M.D.   On: 07/28/2014 08:45   Ct Head Wo Contrast  07/28/2014   CLINICAL DATA:  RECENT FALL, PT. WITH RIGHT ORBITAL HEMATOMA-GREENISH/PURPLEISH IN COLOR, PT. UNABLE TO MOVE WITHOUT ASSISTANCE,  EXAM: CT HEAD WITHOUT CONTRAST  CT MAXILLOFACIAL WITHOUT CONTRAST  CT CERVICAL SPINE WITHOUT CONTRAST  TECHNIQUE: Multidetector CT imaging of the  head, cervical spine, and maxillofacial structures were performed using the standard protocol without intravenous contrast. Multiplanar CT image reconstructions of the cervical spine and maxillofacial structures were also generated.  COMPARISON:  03/06/2014  FINDINGS: CT HEAD FINDINGS  Ventricles normal configuration. There is ventricular and sulcal enlargement reflecting mild to moderate atrophy. No hydrocephalus.  There are no parenchymal masses or mass effect. There is no evidence of a cortical infarct. Mild periventricular white matter hypoattenuation is noted, stable, consistent with chronic small-vessel ischemic change.  There are no extra-axial masses or abnormal fluid collections.  There is no intracranial hemorrhage.  No skull fracture.  CT MAXILLOFACIAL FINDINGS  No fracture. Small foci of mucosal thickening in each maxillary sinus. Sinuses otherwise clear. Clear mastoid air cells and middle ear cavities.  Globes orbits are unremarkable. No soft tissue masses. Mild right lateral periorbital soft tissue edema. Degenerative changes are noted of both temporomandibular joints.  CT CERVICAL SPINE FINDINGS  No fracture. No spondylolisthesis. There are mild disc degenerative changes from C3-C4 through C5-C6 with endplate spurring and mild disc bulging by without significant loss disc height. There is no significant central stenosis or neural foraminal narrowing.  Soft tissues show carotid vascular calcifications but are otherwise unremarkable. Lung apices are clear.  IMPRESSION: HEAD CT:  No acute  intracranial abnormalities.  No skull fracture.  MAXILLOFACIAL CT:  No fracture.  CERVICAL CT:  No fracture or acute finding.   Electronically Signed   By: Amie Portland M.D.   On: 07/28/2014 08:35   Ct Cervical Spine Wo Contrast  07/28/2014   CLINICAL DATA:  RECENT FALL, PT. WITH RIGHT ORBITAL HEMATOMA-GREENISH/PURPLEISH IN COLOR, PT. UNABLE TO MOVE WITHOUT ASSISTANCE,  EXAM: CT HEAD WITHOUT CONTRAST  CT MAXILLOFACIAL WITHOUT CONTRAST  CT CERVICAL SPINE WITHOUT CONTRAST  TECHNIQUE: Multidetector CT imaging of the head, cervical spine, and maxillofacial structures were performed using the standard protocol without intravenous contrast. Multiplanar CT image reconstructions of the cervical spine and maxillofacial structures were also generated.  COMPARISON:  03/06/2014  FINDINGS: CT HEAD FINDINGS  Ventricles normal configuration. There is ventricular and sulcal enlargement reflecting mild to moderate atrophy. No hydrocephalus.  There are no parenchymal masses or mass effect. There is no evidence of a cortical infarct. Mild periventricular white matter hypoattenuation is noted, stable, consistent with chronic small-vessel ischemic change.  There are no extra-axial masses or abnormal fluid collections.  There is no intracranial hemorrhage.  No skull fracture.  CT MAXILLOFACIAL FINDINGS  No fracture. Small foci of mucosal thickening in each maxillary sinus. Sinuses otherwise clear. Clear mastoid air cells and middle ear cavities.  Globes orbits are unremarkable. No soft tissue masses. Mild right lateral periorbital soft tissue edema. Degenerative changes are noted of both temporomandibular joints.  CT CERVICAL SPINE FINDINGS  No fracture. No spondylolisthesis. There are mild disc degenerative changes from C3-C4 through C5-C6 with endplate spurring and mild disc bulging by without significant loss disc height. There is no significant central stenosis or neural foraminal narrowing.  Soft tissues show carotid vascular  calcifications but are otherwise unremarkable. Lung apices are clear.  IMPRESSION: HEAD CT:  No acute intracranial abnormalities.  No skull fracture.  MAXILLOFACIAL CT:  No fracture.  CERVICAL CT:  No fracture or acute finding.   Electronically Signed   By: Amie Portland M.D.   On: 07/28/2014 08:35   Dg Knee Right Port  07/28/2014   CLINICAL DATA:  Pt fell this morning, she has a known right femoral neck Fx. R/o right  knee fx. She has pain all the way down her leg, starting at her hip.  EXAM: PORTABLE RIGHT KNEE - 1-2 VIEW  COMPARISON:  None.  FINDINGS: No acute fracture. Mild medial joint space compartment narrowing. Small marginal osteophytes noted from the medial lateral compartments. Bones are demineralized.  No joint effusion.  Soft tissues are unremarkable.  IMPRESSION: No fracture or dislocation or acute finding.  Mild osteoarthritis.   Electronically Signed   By: Amie Portland M.D.   On: 07/28/2014 10:40   Dg Hip Unilat With Pelvis 2-3 Views Right  07/28/2014   CLINICAL DATA:  Pt fell this morning, seems to be very confused. She has right hip pain, and her foot is rotated inward. No known chest complaints Hx of paroxysmal atrail fibrillation, sick sinus syndrome, HTN  EXAM: RIGHT HIP (WITH PELVIS) 2-3 VIEWS  COMPARISON:  None.  FINDINGS: There is a fracture of the right femoral neck. Fracture is mid cervical. There is a single comminuted fracture fragment along the superior margin of fracture. The shaft fracture component has migrated superiorly, displacing 2.5 cm in relation to femoral head neck component. There is mild varus angulation.  No other fractures. Hip joints are normally aligned. Bones are diffusely demineralized.  IMPRESSION: Displaced right femoral neck fracture as described.   Electronically Signed   By: Amie Portland M.D.   On: 07/28/2014 08:46   Ct Maxillofacial Wo Cm  07/28/2014   CLINICAL DATA:  RECENT FALL, PT. WITH RIGHT ORBITAL HEMATOMA-GREENISH/PURPLEISH IN COLOR, PT. UNABLE  TO MOVE WITHOUT ASSISTANCE,  EXAM: CT HEAD WITHOUT CONTRAST  CT MAXILLOFACIAL WITHOUT CONTRAST  CT CERVICAL SPINE WITHOUT CONTRAST  TECHNIQUE: Multidetector CT imaging of the head, cervical spine, and maxillofacial structures were performed using the standard protocol without intravenous contrast. Multiplanar CT image reconstructions of the cervical spine and maxillofacial structures were also generated.  COMPARISON:  03/06/2014  FINDINGS: CT HEAD FINDINGS  Ventricles normal configuration. There is ventricular and sulcal enlargement reflecting mild to moderate atrophy. No hydrocephalus.  There are no parenchymal masses or mass effect. There is no evidence of a cortical infarct. Mild periventricular white matter hypoattenuation is noted, stable, consistent with chronic small-vessel ischemic change.  There are no extra-axial masses or abnormal fluid collections.  There is no intracranial hemorrhage.  No skull fracture.  CT MAXILLOFACIAL FINDINGS  No fracture. Small foci of mucosal thickening in each maxillary sinus. Sinuses otherwise clear. Clear mastoid air cells and middle ear cavities.  Globes orbits are unremarkable. No soft tissue masses. Mild right lateral periorbital soft tissue edema. Degenerative changes are noted of both temporomandibular joints.  CT CERVICAL SPINE FINDINGS  No fracture. No spondylolisthesis. There are mild disc degenerative changes from C3-C4 through C5-C6 with endplate spurring and mild disc bulging by without significant loss disc height. There is no significant central stenosis or neural foraminal narrowing.  Soft tissues show carotid vascular calcifications but are otherwise unremarkable. Lung apices are clear.  IMPRESSION: HEAD CT:  No acute intracranial abnormalities.  No skull fracture.  MAXILLOFACIAL CT:  No fracture.  CERVICAL CT:  No fracture or acute finding.   Electronically Signed   By: Amie Portland M.D.   On: 07/28/2014 08:35    Other results: EKG: NSR, no ST or t-wave  changes, compared to prior 07/13/08.  Assessment & Plan by Problem: Principal Problem:   Hip fracture, right Active Problems:   Essential hypertension   Atrial fibrillation   SICK SINUS SYNDROME   PACEMAKER, PERMANENT  Repeated falls  #Fall: Anna Val EagleLemons has had two falls in about the past two days. These have been witnessed by home health aid. She hit head on first fall and has R periorbital ecchymosis but no fractures on CT. The fall this morning resulted in R femoral neck fracture. Although R knee pain on palpation for ortho, x-ray negative. Cause of falls unclear. It could be mechanical as they have occurred in the early morning and she has severe dementia. She could be dehydrated as she was dry on exam but cannot get orthostatics as she has too much pain to sit up. PPM interrogated in ED and appears clear. Head CT without acute process but TIA possible. Seizure also considered as she was noted to have dry stool on EMS arrival but health health aid did not note seizure like activity. -f/u neuro consult -echo -UDS -NS @ 75 cc/hr -neuro checks q4h -norco 1-2 tab q4hprn -telemetry -strict bed rest  #R femoral neck fracture: Anna Val EagleLemons has displaced R femoral neck fracture noted on hip x-ray. This is traumatic secondary to fall. She is not able to bear weight on R leg and noted on exam to have shortened R leg externally rotated. Of note, she is anti-coagulated with xarelto for paroxysmal atrial fibrillation. Ortho was consulted in ED and plan for R hip hemiarthroplasty for comfort and quality of life. Son was told my ortho this would likely be Monday 4/18. -appreciate ortho -may eat per ortho -R hip hemiarthroplasty per ortho -hold xarelto for pending surgery -norco 1-2 tab q4hprn -strict bed rest  #Paroxysmal atrial fibrillation: Anna Val EagleLemons is currently in normal sinus rhythm. She is [facility]. Last seen 03/12/14 with no new recommendations. She is on  atenolol 100 mg daily, cartia xt 120 mg daily, xarelto 15 mg daily. -cont atenolol 100 mg daily, cartiz xt 120 mg daily -hold xarelto 15 mg daily for surgery  #Elevated transaminase: Anna Val EagleLemons has AST 206 with ALT 45. Her family denies any alcohol use. No abdominal imaging on EMR. -hepatitis panel -cont to monitor  #Sick sinus syndrome s/p PPM: Anna Val EagleLemons is s/p permanent pacemaker secondary to sick sinus syndrome. She is followed by Dr Hillis RangeJames Allred [facility]. Last seen 03/12/14 with normal pacemaker function and no changes. Interrogated in ED and appears normal. She is on cartia xt 120 mg daily. -cont cartia xt 120 mg daily  #HTN: BP in ED 110s-140s/30s-70s. At home she is on atenolol 10 mg daily, cartiz xt 120 mg daily, atorvastatin 10 mg daily. -cont atenolol 10 mg daily, cartiz xt 120 mg daily, atorvastatin 10 mg daily  #HL: No lipid panel in our EMR. At home she is on atorvastatin 10 mg daily. -continue atorvastatin 10 mg daily  #Advanced Dementia: Anna Val EagleLemons is alert and oriented x 1 only to self. She could not answer simple review of system questions or engage in conversation. Her son said that this is her baseline. He says she lives alone but has 24 hour home health aid. At home she is on donepezil 10 mg daily. -check TSH -cont donepezil 10 mg daily -consult social work  #Diet: HH pending swallow screen  #DVT PPx: SCDs  #Code: Full   Dispo: Disposition is deferred at this time, awaiting improvement of current medical problems. Anticipated discharge in approximately 2-3 day(s).   The patient does have a current PCP (Ignatius Speckinghruv B. Vyas, MD) and does need an [facility] after discharge.  The patient does not know have transportation limitations that hinder transportation to clinic appointments.  Signed: Lorenda Hatchet, MD 07/28/2014, 2:06 PM

## 2014-07-28 NOTE — ED Notes (Signed)
  Pt has a right hip fracture. Orthostatic vitals are unable to obtain.

## 2014-07-28 NOTE — Progress Notes (Signed)
Patient ID: Anna Sutton, female   DOB: 08/10/1940, 74 y.o.   MRN: [id number] I have reviewed the x-rays as well as the note from Rexene EdisonGil Clark, PA-C ( it is accidentally listed under Dr. Bari Edwardrtman's name who has no involvement with this patient.  She needs a right hip hemiarthroplasty to treat her right hip femoral neck fracture.  She is on Xarelto, so we will defer surgery until tomorrow as well as pending medical clearance and optimization for surgery.

## 2014-07-28 NOTE — Consult Note (Addendum)
Reason for Consult: Falls - question seizure activity Referring Physician: Dr Dalphine Handing  CC:   HPI: Anna Sutton is a 74 y.o. female with a history of advanced dementia, paroxysmal atrial fibrillation on Xarelto, permanent pacemaker for sick sinus syndrome, hypertension, and two recent falls with a subseuent displaced right femoral neck fracture this morning. Neurology has been asked to consult to try to determine if there has been seizure activity which has contributed to the patient's falls. The majority of the information in the history and physical was obtained from the patient's son due to the patient's dementia. He did not witness the falls; although, a home health aide apparently witnessed the first fall several days ago as the patient was being helped to the bathroom. The aide heard the fall this morning and was there immediately. No seizure-like activity was reported and no loss of consciousness. The patient did have stool incontinence today. No postictal symptoms were reported. The patient's pacemaker was interrogated in the emergency department today and reportedly there were no significant findings. A CT of the head without contrast was unremarkable.The patient apparently had a syncopal episode approximately one year ago that was felt to be secondary to dehydration. I spoke to the patient's son and he reported that despite her dementia she is very active and ambulates without an assistive device. Surgery has been scheduled for tomorrow. CT brain was personally reviewed and showed no acute abnormality.  Past Medical History  Diagnosis Date  . Paroxysmal atrial fibrillation   . Hyperlipidemia   . Hypertension   . Sick sinus syndrome     s/p PPM (MDT)  . Pacemaker     Past Surgical History  Procedure Laterality Date  . Pacemaker placement  12/13/08    MDT Adapta L implanted by Dr Johney Frame    Family History  Problem Relation Age of Onset  . Heart failure    The patient's son reports  that there is no family history of strokes or seizures.  Social History:  reports that she has never smoked. She has never used smokeless tobacco. She reports that she does not drink alcohol or use illicit drugs. Family history: unable to obtain due to mental status No Known Allergies  Medications:  Scheduled: . atenolol  100 mg Oral Daily  . atorvastatin  10 mg Oral Daily  . diltiazem  120 mg Oral Daily  . donepezil  10 mg Oral Daily  . multivitamin with minerals  1 tablet Oral Daily    WJX:BJYNWG to obtain due to mental status History obtained from the patient's son  General ROS: negative for - chills, fatigue, fever, night sweats, weight gain or weight loss Psychological ROS: negative for - behavioral disorder, hallucinations, memory difficulties, mood swings or suicidal ideation Ophthalmic ROS: negative for - blurry vision, double vision, eye pain or loss of vision ENT ROS: negative for - epistaxis, nasal discharge, oral lesions, sore throat, tinnitus or vertigo Allergy and Immunology ROS: negative for - hives or itchy/watery eyes Hematological and Lymphatic ROS: negative for - bleeding problems, bruising or swollen lymph nodes Endocrine ROS: negative for - galactorrhea, hair pattern changes, polydipsia/polyuria or temperature intolerance Respiratory ROS: negative for - cough, hemoptysis, shortness of breath or wheezing Cardiovascular ROS: negative for - chest pain, dyspnea on exertion, edema or irregular heartbeat Gastrointestinal ROS: negative for - abdominal pain, diarrhea, hematemesis, nausea/vomiting or stool incontinence Genito-Urinary ROS: negative for - dysuria, hematuria, incontinence or urinary frequency/urgency Musculoskeletal ROS: negative for - joint swelling or muscular  weakness Neurological ROS: as noted in HPI Dermatological ROS: Positive for recent abrasion of the left upper extremity secondary to patient's continued scratching this irritated area.    Physical  Examination: Blood pressure 150/54, pulse 63, temperature 99.6 F (37.6 C), temperature source Oral, resp. rate 17, SpO2 98 %.  General - pleasant 74 year old female with ecchymosis of the right orbit. Head: normocephalic. Neck: supple Heart - Regular rate and rhythm - no murmer noted. Lungs - Clear to auscultation Abdomen - Soft - non tender Extremities - Distal pulses intact - no edema Skin - Warm and dry  Neurologic Examination  Mental Status: Alert, but very confused. The patient has no idea of the events from this morning, her current situation, place or date.  Speech fluent without evidence of aphasia.  Able to follow 1 step commands with prompting. Cranial Nerves: II: Discs not visualized; Visual fields could not be adequately tested, pupils equal, round, reactive to light and accommodation III,IV, VI: ptosis not present, extra-ocular motions intact bilaterally V,VII: smile symmetric, facial light touch sensation normal bilaterally VIII: hearing normal bilaterally IX,X: gag reflex present XI: bilateral shoulder shrug XII: midline tongue extension Motor: Patient able to move all extremities against gravity except the right lower extremity which was not tested secondary to her displaced femur fracture. She was able to flex and dorsiflex her foot on the right. Tone and bulk:normal tone throughout; mild atrophy noted Sensory: Light touch intact throughout, bilaterally Deep Tendon Reflexes: Unable to elicit due to patient's inability to relax her extremities. Plantars: Right: downgoing   Left: downgoing Cerebellar: Patient was not able to follow commands for finger-to-nose testing or heel-to-shin testing. Gait: Deferred CV: pulses palpable throughout     Laboratory Studies:   Basic Metabolic Panel:  Recent Labs Lab 07/28/14 0742  NA 140  K 3.8  CL 100  CO2 26  GLUCOSE 171*  BUN 12  CREATININE 0.91  CALCIUM 8.9    Liver Function Tests:  Recent Labs Lab  07/28/14 0742  AST 206*  ALT 45*  ALKPHOS 108  BILITOT 0.9  PROT 6.9  ALBUMIN 3.6   No results for input(s): LIPASE, AMYLASE in the last 168 hours. No results for input(s): AMMONIA in the last 168 hours.  CBC:  Recent Labs Lab 07/28/14 0742  WBC 12.6*  NEUTROABS 11.1*  HGB 11.9*  HCT 37.2  MCV 90.7  PLT 226    Cardiac Enzymes:  Recent Labs Lab 07/28/14 0742  CKTOTAL 67    BNP: Invalid input(s): POCBNP  CBG: No results for input(s): GLUCAP in the last 168 hours.  Microbiology: No results found for this or any previous visit.  Coagulation Studies:  Recent Labs  07/28/14 0742  LABPROT 16.9*  INR 1.36    Urinalysis:  Recent Labs Lab 07/28/14 0740  COLORURINE YELLOW  LABSPEC 1.005  PHURINE 7.0  GLUCOSEU NEGATIVE  HGBUR NEGATIVE  BILIRUBINUR NEGATIVE  KETONESUR NEGATIVE  PROTEINUR NEGATIVE  UROBILINOGEN 0.2  NITRITE NEGATIVE  LEUKOCYTESUR SMALL*    Lipid Panel:  No results found for: CHOL, TRIG, HDL, CHOLHDL, VLDL, LDLCALC  HgbA1C: No results found for: HGBA1C  Urine Drug Screen:  No results found for: LABOPIA, COCAINSCRNUR, LABBENZ, AMPHETMU, THCU, LABBARB  Alcohol Level: No results for input(s): ETH in the last 168 hours.  Other results: EKG: Sinus rhythm rate 66 bpm. Please refer to the formal cardiology reading for complete details.  Imaging:  Dg Chest 1 View 07/28/2014    No active disease.  Ct Head Wo Contrast 07/28/2014    HEAD CT:  No acute intracranial abnormalities.  No skull fracture.  MAXILLOFACIAL CT:  No fracture.   CERVICAL CT:  No fracture or acute finding.        Dg Knee Right Port 07/28/2014    No fracture or dislocation or acute finding.  Mild osteoarthritis.      Dg Hip Unilat With Pelvis 2-3 Views Right 07/28/2014    Displaced right femoral neck fracture.    Assessment/Plan: Pleasant 74 year old female with 2 recent falls one of which was witnessed. No frank seizure activity observed. No loss of  consciousness. No postictal symptoms described. No previous seizure history. Incontinent of stool today when she fell and fractured her femur or possibly fractured her femur and fell. No convincing evidence of seizure activity at this time with normal neurologic exam other than advanced dementia. CT brain was personally reviewed and showed no acute abnormality. Consider EEG for further evaluation, as certainly dementia confers a higher risk of seizues. Unable to have MRI secondary permanent pacemaker. Hip surgery tentatively planned for tomorrow. EEG will not be available until Monday.  Dr Leroy Kennedy to see and evaluate patient.   Delton See PA-C [facility] Pager [phone] 07/28/2014, 2:14 PM  Patient seen and examined together with physician assistant and I concur with the assessment and plan.  Wyatt Portela, MD

## 2014-07-28 NOTE — ED Notes (Signed)
MRI states due to pacemaker unable to scan. Patient hooked back up to monitor. Patient IV catheter noted to be on patient chest with tegaderm still covering location where catheter was. Bleeding controlled. catheter intact.

## 2014-07-29 ENCOUNTER — Encounter (HOSPITAL_COMMUNITY): Payer: Self-pay | Admitting: Anesthesiology

## 2014-07-29 ENCOUNTER — Encounter (HOSPITAL_COMMUNITY)
Admission: EM | Disposition: A | Discharge status: Skilled Nursing Facility | Payer: Self-pay | Source: Home / Self Care | Attending: Internal Medicine

## 2014-07-29 ENCOUNTER — Inpatient Hospital Stay (HOSPITAL_COMMUNITY): Payer: Medicare Other | Admitting: Anesthesiology

## 2014-07-29 ENCOUNTER — Inpatient Hospital Stay (HOSPITAL_COMMUNITY): Payer: Medicare Other

## 2014-07-29 DIAGNOSIS — Z01810 Encounter for preprocedural cardiovascular examination: Secondary | ICD-10-CM

## 2014-07-29 DIAGNOSIS — E785 Hyperlipidemia, unspecified: Secondary | ICD-10-CM

## 2014-07-29 DIAGNOSIS — S72001A Fracture of unspecified part of neck of right femur, initial encounter for closed fracture: Principal | ICD-10-CM

## 2014-07-29 DIAGNOSIS — R740 Nonspecific elevation of levels of transaminase and lactic acid dehydrogenase [LDH]: Secondary | ICD-10-CM

## 2014-07-29 DIAGNOSIS — I10 Essential (primary) hypertension: Secondary | ICD-10-CM

## 2014-07-29 DIAGNOSIS — F0390 Unspecified dementia without behavioral disturbance: Secondary | ICD-10-CM

## 2014-07-29 DIAGNOSIS — I480 Paroxysmal atrial fibrillation: Secondary | ICD-10-CM

## 2014-07-29 HISTORY — PX: HIP ARTHROPLASTY: SHX981

## 2014-07-29 LAB — RAPID URINE DRUG SCREEN, HOSP PERFORMED
Amphetamines: NOT DETECTED
BENZODIAZEPINES: NOT DETECTED
Barbiturates: NOT DETECTED
Cocaine: NOT DETECTED
Opiates: POSITIVE — AB
Tetrahydrocannabinol: NOT DETECTED

## 2014-07-29 LAB — CBC
HCT: 35.2 % — ABNORMAL LOW (ref 36.0–46.0)
Hemoglobin: 11.1 g/dL — ABNORMAL LOW (ref 12.0–15.0)
MCH: 28.9 pg (ref 26.0–34.0)
MCHC: 31.5 g/dL (ref 30.0–36.0)
MCV: 91.7 fL (ref 78.0–100.0)
PLATELETS: 174 10*3/uL (ref 150–400)
RBC: 3.84 MIL/uL — AB (ref 3.87–5.11)
RDW: 14.2 % (ref 11.5–15.5)
WBC: 12.2 10*3/uL — ABNORMAL HIGH (ref 4.0–10.5)

## 2014-07-29 LAB — COMPREHENSIVE METABOLIC PANEL
ALT: 169 U/L — ABNORMAL HIGH (ref 0–35)
AST: 226 U/L — AB (ref 0–37)
Albumin: 3 g/dL — ABNORMAL LOW (ref 3.5–5.2)
Alkaline Phosphatase: 119 U/L — ABNORMAL HIGH (ref 39–117)
Anion gap: 10 (ref 5–15)
BILIRUBIN TOTAL: 1.4 mg/dL — AB (ref 0.3–1.2)
BUN: 12 mg/dL (ref 6–23)
CALCIUM: 8.6 mg/dL (ref 8.4–10.5)
CHLORIDE: 100 mmol/L (ref 96–112)
CO2: 27 mmol/L (ref 19–32)
CREATININE: 0.94 mg/dL (ref 0.50–1.10)
GFR calc Af Amer: 67 mL/min — ABNORMAL LOW (ref >90)
GFR calc non Af Amer: 57 mL/min — ABNORMAL LOW (ref >90)
Glucose, Bld: 133 mg/dL — ABNORMAL HIGH (ref 70–99)
Potassium: 3.8 mmol/L (ref 3.5–5.1)
Sodium: 137 mmol/L (ref 135–145)
Total Protein: 6.2 g/dL (ref 6.0–8.3)

## 2014-07-29 LAB — PROTIME-INR
INR: 1.27 (ref 0.00–1.49)
Prothrombin Time: 16 seconds — ABNORMAL HIGH (ref 11.6–15.2)

## 2014-07-29 LAB — GLUCOSE, CAPILLARY: Glucose-Capillary: 112 mg/dL — ABNORMAL HIGH (ref 70–99)

## 2014-07-29 LAB — SURGICAL PCR SCREEN
MRSA, PCR: NEGATIVE
Staphylococcus aureus: POSITIVE — AB

## 2014-07-29 LAB — APTT: APTT: 32 s (ref 24–37)

## 2014-07-29 LAB — LACTIC ACID, PLASMA: LACTIC ACID, VENOUS: 1.2 mmol/L (ref 0.5–2.0)

## 2014-07-29 SURGERY — HEMIARTHROPLASTY, HIP, DIRECT ANTERIOR APPROACH, FOR FRACTURE
Anesthesia: General | Site: Leg Upper | Laterality: Right

## 2014-07-29 MED ORDER — WHITE PETROLATUM GEL
Status: AC
  Administered 2014-07-29: 17:00:00
  Filled 2014-07-29: qty 1

## 2014-07-29 MED ORDER — FENTANYL CITRATE (PF) 100 MCG/2ML IJ SOLN
INTRAMUSCULAR | Status: DC | PRN
  Administered 2014-07-29: 25 ug via INTRAVENOUS
  Administered 2014-07-29: 50 ug via INTRAVENOUS
  Administered 2014-07-29: 75 ug via INTRAVENOUS
  Administered 2014-07-29 (×4): 25 ug via INTRAVENOUS

## 2014-07-29 MED ORDER — PNEUMOCOCCAL VAC POLYVALENT 25 MCG/0.5ML IJ INJ: 0.5000 mL | INJECTION | INTRAMUSCULAR | Status: DC

## 2014-07-29 MED ORDER — ARTIFICIAL TEARS OP OINT
TOPICAL_OINTMENT | OPHTHALMIC | Status: AC
  Filled 2014-07-29: qty 3.5

## 2014-07-29 MED ORDER — PROPOFOL 10 MG/ML IV BOLUS
INTRAVENOUS | Status: AC
  Filled 2014-07-29: qty 20

## 2014-07-29 MED ORDER — HYDROCODONE-ACETAMINOPHEN 5-325 MG PO TABS: 1.0000 | ORAL_TABLET | Freq: Four times a day (QID) | ORAL | Status: DC | PRN

## 2014-07-29 MED ORDER — SODIUM CHLORIDE 0.9 % IR SOLN
Status: DC | PRN
  Administered 2014-07-29: 1000 mL

## 2014-07-29 MED ORDER — METOCLOPRAMIDE HCL 5 MG PO TABS: 5.0000 mg | ORAL_TABLET | Freq: Three times a day (TID) | ORAL | Status: DC | PRN

## 2014-07-29 MED ORDER — ROCURONIUM BROMIDE 100 MG/10ML IV SOLN
INTRAVENOUS | Status: DC | PRN
  Administered 2014-07-29: 20 mg via INTRAVENOUS

## 2014-07-29 MED ORDER — ONDANSETRON HCL 4 MG/2ML IJ SOLN
INTRAMUSCULAR | Status: AC
  Filled 2014-07-29: qty 2

## 2014-07-29 MED ORDER — METOCLOPRAMIDE HCL 5 MG/ML IJ SOLN: 5.0000 mg | Freq: Three times a day (TID) | INTRAMUSCULAR | Status: DC | PRN

## 2014-07-29 MED ORDER — MENTHOL 3 MG MT LOZG: 1.0000 | LOZENGE | OROMUCOSAL | Status: DC | PRN

## 2014-07-29 MED ORDER — FENTANYL CITRATE (PF) 250 MCG/5ML IJ SOLN
INTRAMUSCULAR | Status: AC
  Filled 2014-07-29: qty 5

## 2014-07-29 MED ORDER — METHOCARBAMOL 1000 MG/10ML IJ SOLN
500.0000 mg | Freq: Four times a day (QID) | INTRAMUSCULAR | Status: DC | PRN
  Administered 2014-07-30: 500 mg via INTRAVENOUS
  Filled 2014-07-29 (×3): qty 5

## 2014-07-29 MED ORDER — ONDANSETRON HCL 4 MG/2ML IJ SOLN: 4.0000 mg | Freq: Four times a day (QID) | INTRAMUSCULAR | Status: DC | PRN

## 2014-07-29 MED ORDER — ROCURONIUM BROMIDE 50 MG/5ML IV SOLN
INTRAVENOUS | Status: AC
  Filled 2014-07-29: qty 1

## 2014-07-29 MED ORDER — LACTATED RINGERS IV SOLN
INTRAVENOUS | Status: DC | PRN
  Administered 2014-07-29 (×2): via INTRAVENOUS

## 2014-07-29 MED ORDER — PHENYLEPHRINE HCL 10 MG/ML IJ SOLN
INTRAMUSCULAR | Status: DC | PRN
  Administered 2014-07-29: 80 ug via INTRAVENOUS
  Administered 2014-07-29: 40 ug via INTRAVENOUS

## 2014-07-29 MED ORDER — ACETAMINOPHEN 650 MG RE SUPP: 650.0000 mg | Freq: Four times a day (QID) | RECTAL | Status: DC | PRN

## 2014-07-29 MED ORDER — ONDANSETRON HCL 4 MG/2ML IJ SOLN
INTRAMUSCULAR | Status: DC | PRN
  Administered 2014-07-29: 4 mg via INTRAVENOUS

## 2014-07-29 MED ORDER — ACETAMINOPHEN 325 MG PO TABS: 650.0000 mg | ORAL_TABLET | Freq: Four times a day (QID) | ORAL | Status: DC | PRN

## 2014-07-29 MED ORDER — ONDANSETRON HCL 4 MG PO TABS: 4.0000 mg | ORAL_TABLET | Freq: Four times a day (QID) | ORAL | Status: DC | PRN

## 2014-07-29 MED ORDER — SUCCINYLCHOLINE CHLORIDE 20 MG/ML IJ SOLN
INTRAMUSCULAR | Status: AC
  Filled 2014-07-29: qty 1

## 2014-07-29 MED ORDER — FENTANYL CITRATE (PF) 100 MCG/2ML IJ SOLN: 25.0000 ug | INTRAMUSCULAR | Status: DC | PRN

## 2014-07-29 MED ORDER — ASPIRIN EC 325 MG PO TBEC
325.0000 mg | DELAYED_RELEASE_TABLET | Freq: Every day | ORAL | Status: DC
  Administered 2014-07-30 – 2014-07-31 (×2): 325 mg via ORAL
  Filled 2014-07-29 (×2): qty 1

## 2014-07-29 MED ORDER — DILTIAZEM HCL ER COATED BEADS 120 MG PO CP24
120.0000 mg | ORAL_CAPSULE | Freq: Every day | ORAL | Status: DC
Start: 2014-07-29 — End: 2014-07-31
  Administered 2014-07-29 – 2014-07-31 (×3): 120 mg via ORAL
  Filled 2014-07-29 (×3): qty 1

## 2014-07-29 MED ORDER — PHENOL 1.4 % MT LIQD: 1.0000 | OROMUCOSAL | Status: DC | PRN

## 2014-07-29 MED ORDER — CEFAZOLIN SODIUM-DEXTROSE 2-3 GM-% IV SOLR
2.0000 g | Freq: Four times a day (QID) | INTRAVENOUS | Status: AC
  Administered 2014-07-29 – 2014-07-30 (×2): 2 g via INTRAVENOUS
  Filled 2014-07-29 (×2): qty 50

## 2014-07-29 MED ORDER — SUCCINYLCHOLINE CHLORIDE 20 MG/ML IJ SOLN
INTRAMUSCULAR | Status: DC | PRN
  Administered 2014-07-29: 80 mg via INTRAVENOUS

## 2014-07-29 MED ORDER — PROPOFOL 10 MG/ML IV BOLUS
INTRAVENOUS | Status: DC | PRN
  Administered 2014-07-29: 100 mg via INTRAVENOUS

## 2014-07-29 MED ORDER — SODIUM CHLORIDE 0.9 % IV SOLN
INTRAVENOUS | Status: DC
  Administered 2014-07-30 – 2014-07-31 (×3): via INTRAVENOUS

## 2014-07-29 MED ORDER — MORPHINE SULFATE 2 MG/ML IJ SOLN
0.5000 mg | INTRAMUSCULAR | Status: DC | PRN
Start: 2014-07-29 — End: 2014-07-30
  Administered 2014-07-29 – 2014-07-30 (×2): 0.5 mg via INTRAVENOUS
  Filled 2014-07-29 (×2): qty 1

## 2014-07-29 MED ORDER — CEFAZOLIN SODIUM-DEXTROSE 2-3 GM-% IV SOLR
INTRAVENOUS | Status: DC | PRN
  Administered 2014-07-29: 2 g via INTRAVENOUS

## 2014-07-29 MED ORDER — METHOCARBAMOL 500 MG PO TABS
500.0000 mg | ORAL_TABLET | Freq: Four times a day (QID) | ORAL | Status: DC | PRN
  Administered 2014-07-31: 500 mg via ORAL
  Filled 2014-07-29 (×2): qty 1

## 2014-07-29 MED ORDER — LIDOCAINE HCL (CARDIAC) 20 MG/ML IV SOLN
INTRAVENOUS | Status: AC
  Filled 2014-07-29: qty 5

## 2014-07-29 MED ORDER — CETYLPYRIDINIUM CHLORIDE 0.05 % MT LIQD
7.0000 mL | Freq: Two times a day (BID) | OROMUCOSAL | Status: DC
  Administered 2014-07-30 – 2014-07-31 (×4): 7 mL via OROMUCOSAL

## 2014-07-29 SURGICAL SUPPLY — 42 items
BLADE SAGITTAL (BLADE) ×3
BLADE SAW THK.89X75X18XSGTL (BLADE) ×1 IMPLANT
CAPT HIP HEMI 1 ×2 IMPLANT
COVER SURGICAL LIGHT HANDLE (MISCELLANEOUS) ×3 IMPLANT
DRAPE HIP W/POCKET STRL (DRAPE) ×3 IMPLANT
DRAPE IMP U-DRAPE 54X76 (DRAPES) ×3 IMPLANT
DRAPE INCISE IOBAN 85X60 (DRAPES) ×6 IMPLANT
DRAPE U-SHAPE 47X51 STRL (DRAPES) ×3 IMPLANT
DRSG AQUACEL AG ADV 3.5X10 (GAUZE/BANDAGES/DRESSINGS) ×2 IMPLANT
DRSG MEPILEX BORDER 4X8 (GAUZE/BANDAGES/DRESSINGS) ×3 IMPLANT
DURAPREP 26ML APPLICATOR (WOUND CARE) ×3 IMPLANT
ELECT BLADE 6.5 EXT (BLADE) ×2 IMPLANT
ELECT CAUTERY BLADE 6.4 (BLADE) ×3 IMPLANT
ELECT REM PT RETURN 9FT ADLT (ELECTROSURGICAL) ×3
ELECTRODE REM PT RTRN 9FT ADLT (ELECTROSURGICAL) ×1 IMPLANT
EVACUATOR 1/8 PVC DRAIN (DRAIN) IMPLANT
GLOVE BIOGEL PI IND STRL 7.5 (GLOVE) ×1 IMPLANT
GLOVE BIOGEL PI IND STRL 8 (GLOVE) ×1 IMPLANT
GLOVE BIOGEL PI INDICATOR 7.5 (GLOVE) ×2
GLOVE BIOGEL PI INDICATOR 8 (GLOVE) ×2
GLOVE ORTHO TXT STRL SZ7.5 (GLOVE) ×8 IMPLANT
GOWN STRL REUS W/ TWL LRG LVL3 (GOWN DISPOSABLE) ×2 IMPLANT
GOWN STRL REUS W/ TWL XL LVL3 (GOWN DISPOSABLE) ×4 IMPLANT
GOWN STRL REUS W/TWL LRG LVL3 (GOWN DISPOSABLE) ×6
GOWN STRL REUS W/TWL XL LVL3 (GOWN DISPOSABLE) ×12
KIT BASIN OR (CUSTOM PROCEDURE TRAY) ×3 IMPLANT
KIT ROOM TURNOVER OR (KITS) ×3 IMPLANT
MANIFOLD NEPTUNE II (INSTRUMENTS) ×3 IMPLANT
NS IRRIG 1000ML POUR BTL (IV SOLUTION) ×3 IMPLANT
PACK TOTAL JOINT (CUSTOM PROCEDURE TRAY) ×3 IMPLANT
PACK UNIVERSAL I (CUSTOM PROCEDURE TRAY) ×3 IMPLANT
PAD ARMBOARD 7.5X6 YLW CONV (MISCELLANEOUS) ×6 IMPLANT
STAPLER VISISTAT 35W (STAPLE) ×3 IMPLANT
SUT ETHIBOND NAB CT1 #1 30IN (SUTURE) ×6 IMPLANT
SUT VIC AB 0 CT1 27 (SUTURE) ×3
SUT VIC AB 0 CT1 27XBRD ANBCTR (SUTURE) IMPLANT
SUT VIC AB 1 CTB1 27 (SUTURE) ×4 IMPLANT
SUT VIC AB 2-0 CT1 27 (SUTURE) ×3
SUT VIC AB 2-0 CT1 TAPERPNT 27 (SUTURE) ×2 IMPLANT
TOWEL OR 17X24 6PK STRL BLUE (TOWEL DISPOSABLE) ×3 IMPLANT
TOWEL OR 17X26 10 PK STRL BLUE (TOWEL DISPOSABLE) ×3 IMPLANT
TRAY FOLEY CATH 16FRSI W/METER (SET/KITS/TRAYS/PACK) IMPLANT

## 2014-07-29 NOTE — Anesthesia Preprocedure Evaluation (Addendum)
Anesthesia Evaluation  Patient identified by MRN, date of birth, ID band Patient awake    Reviewed: Allergy & Precautions, NPO status , Patient's Chart, lab work & pertinent test results  Airway Mallampati: III  TM Distance: >3 FB Neck ROM: Limited    Dental   Pulmonary sleep apnea ,  breath sounds clear to auscultation        Cardiovascular hypertension, Pt. on medications and Pt. on home beta blockers + dysrhythmias Atrial Fibrillation + pacemaker Rhythm:Regular Rate:Normal     Neuro/Psych negative neurological ROS     GI/Hepatic negative GI ROS, Transaminitis   Endo/Other  negative endocrine ROS  Renal/GU negative Renal ROS     Musculoskeletal negative musculoskeletal ROS (+)   Abdominal   Peds  Hematology  (+) anemia , Hgb 11.1   Anesthesia Other Findings   Reproductive/Obstetrics                           Anesthesia Physical Anesthesia Plan  ASA: III  Anesthesia Plan: General   Post-op Pain Management:    Induction: Intravenous  Airway Management Planned: Oral ETT  Additional Equipment:   Intra-op Plan:   Post-operative Plan: Extubation in OR  Informed Consent: I have reviewed the patients History and Physical, chart, labs and discussed the procedure including the risks, benefits and alternatives for the proposed anesthesia with the patient or authorized representative who has indicated his/her understanding and acceptance.   Dental advisory given  Plan Discussed with: CRNA  Anesthesia Plan Comments:         Anesthesia Quick Evaluation

## 2014-07-29 NOTE — Anesthesia Postprocedure Evaluation (Signed)
  Anesthesia Post-op Note  Patient: Anna Sutton  Procedure(s) Performed: Procedure(s): HIP HEMIARTHROPLASTY (Right)  Patient Location: PACU  Anesthesia Type:General  Level of Consciousness: awake and alert   Airway and Oxygen Therapy: Patient Spontanous Breathing  Post-op Pain: mild  Post-op Assessment: Post-op Vital signs reviewed  Post-op Vital Signs: Reviewed  Last Vitals:  Filed Vitals:   07/29/14 1651  BP: 132/70  Pulse: 116  Temp: 37.2 C  Resp: 19    Complications: No apparent anesthesia complications

## 2014-07-29 NOTE — Brief Op Note (Signed)
07/28/2014 - 07/29/2014  3:26 PM  PATIENT:  Efrain Sellaarol H Mccaughan  74 y.o. female  PRE-OPERATIVE DIAGNOSIS:  right hip fx  POST-OPERATIVE DIAGNOSIS:  right hip fx  PROCEDURE:  Procedure(s): HIP HEMIARTHROPLASTY (Right)  SURGEON:  Surgeon(s) and Role:    * Kathryne Hitchhristopher Y Gorman Safi, MD - Primary  PHYSICIAN ASSISTANT: Rexene EdisonGil Clark, PA-C  ANESTHESIA:   general  EBL:  Total I/O In: 1000 [I.V.:1000] Out: 750 [Urine:750]  BLOOD ADMINISTERED:none  DRAINS: none   LOCAL MEDICATIONS USED:  NONE  SPECIMEN:  No Specimen  DISPOSITION OF SPECIMEN:  N/A  COUNTS:  YES  TOURNIQUET:  * No tourniquets in log *  DICTATION: .Other Dictation: Dictation Number [id number]  PLAN OF CARE: Admit to inpatient   PATIENT DISPOSITION:  PACU - hemodynamically stable.   Delay start of Pharmacological VTE agent (>24hrs) due to surgical blood loss or risk of bleeding: not applicable

## 2014-07-29 NOTE — Transfer of Care (Signed)
Immediate Anesthesia Transfer of Care Note  Patient: Efrain Sellaarol H Summa  Procedure(s) Performed: Procedure(s): HIP HEMIARTHROPLASTY (Right)  Patient Location: PACU  Anesthesia Type:General  Level of Consciousness: sedated  Airway & Oxygen Therapy: Patient Spontanous Breathing and Patient connected to face mask oxygen  Post-op Assessment: Report given to RN and Post -op Vital signs reviewed and stable  Post vital signs: Reviewed and stable  Last Vitals:  Filed Vitals:   07/29/14 1303  BP: 139/66  Pulse: 135  Temp: 37.4 C  Resp: 16    Complications: No apparent anesthesia complications

## 2014-07-29 NOTE — Progress Notes (Signed)
  Echocardiogram 2D Echocardiogram has been performed.  Arvil ChacoFoster, Pernell Dikes 07/29/2014, 11:00 AM

## 2014-07-29 NOTE — Op Note (Signed)
NAMEEXILDA, Anna Sutton                ACCOUNT NO.:  [id number]  MEDICAL RECORD NO.:  [id number]  LOCATION:  5N30C                        FACILITY:  [facility]  PHYSICIAN:  Vanita Panda. Magnus Ivan, M.D.DATE OF BIRTH:  1940-08-31  DATE OF PROCEDURE:  07/29/2014 DATE OF DISCHARGE:                              OPERATIVE REPORT   PREOPERATIVE DIAGNOSIS:  Displaced right hip, femoral neck fracture.  POSTOPERATIVE DIAGNOSIS:  Displaced right hip, femoral neck fracture.  PROCEDURE:  Right hip hemiarthroplasty.  IMPLANTS:  [employer] Summit Basic Press-Fit stem size 5, size 45+ 0 unipolar head.  SURGEON:  Vanita Panda. Magnus Ivan, MD  ASSISTANT:  Richardean Canal, PA-C  ANESTHESIA:  General.  ANTIBIOTICS:  2 g IV Ancef.  BLOOD LOSS:  Less than 500 mL.  COMPLICATIONS:  None.  INDICATIONS:  Anna Sutton is a very pleasant 74 year old female with significant dementia who sustained a mechanical fall yesterday.  She was seen in [facility] Emergency Room at [facility] and found to have a displaced right hip femoral neck fracture.  She was admitted to the Medicine Service and then medical clearance as well as cardiac clearance was obtained.  I talked to her son, he is the healthcare power of attorney and she is on the ambulator and he does wish for her to have a right hip hemiarthroplasty to help with her mobility, decrease her pain and hopefully improve her quality of life in light of her dementia.  She is someone who did not ambulate with any type of assistance before this and that she is very pleasantly demented.  I did explain the risk of acute blood loss, anemia, nerve and vessel injury, fracture, infection, dislocation, DVT.  They understand the goals are decreased pain and improved mobility as well as hopefully improve quality of life.  PROCEDURE DESCRIPTION:  After informed consent was obtained, appropriate right hip was marked.  She was brought to the operating room.  General anesthesia was obtained  while she was on her stretcher.  Next, she was placed supine on the operating table.  She was then turned to lateral decubitus position with the right operative hip up, padding down the nonoperative left leg and axillary rolls in place and appropriate positioning of head and neck and padding of her arms.  Her right operative hip was then prepped and draped with DuraPrep and sterile drapes.  Time-out was called and she was identified as correct patient, correct right hip.  I then made an incision over the greater trochanter and carried this proximally and distally.  I dissected down to the iliotibial band and then divided the iliotibial band.  I then proceeded with an anterolateral approach to the hip.  I took down a small sleeve of the gluteus medius and minimus tendon off the greater trochanter. Following these anteriorly, I was able to dissect down the hip joint capsule in a T-type format finding a large hematoma.  This was irrigated and cleaned.  I then placed Cobra retractors around the femoral neck and made my femoral neck cut with an oscillating saw and completed this with an osteotome and a rongeur.  We removed the femoral head with a corkscrew guide and felt that a  size 45 head was the appropriate stability.  Attention was then turned to the femur with the leg flexed and externally rotated off the table into a leg bag.  I was able to use an initiating reamer, canal finder and then a lateralizing reamer.  I then began broaching from the size 1 broach using the [employer] Basic Press- Fit broaching system up to a size 5.  With a size 5, we trialed a standard neck and a 45+ 0 hip ball reduced this in the acetabulum and I was pleased with leg lengths and stability.  We then removed the trial components and placed the Real Summit Basic Press-Fit femoral component size 5 followed by the size 45 unipolar head with a +0 spacer.  Again reduced this, acetabulum was stable.  I then copiously  irrigated soft tissues with normal saline solution and then we closed the joint capsule with interrupted #1 Ethibond.  I reapproximated the gluteus medius and minimus tendon back to the greater trochanter through drill holes and to the sleeve of the tendon using a #1 Ethibond suture.  The iliotibial band was closed with interrupted #1 Vicryl followed by 0 Vicryl in the deep tissue, 2-0 Vicryl in subcutaneous tissue, staples on the skin and Aquacel dressing was applied.  She was then rolled back to supine position, awakened, and extubated, taken to the recovery room in stable condition.  All final counts were correct.  There were no complications noted.  Of note, Richardean CanalGilbert Clark, PA-C, assisted in the entire case and assistance was crucial for facilitating all aspects of this case.     Vanita Pandahristopher Y. Magnus IvanBlackman, M.D.     CYB/MEDQ  D:  07/29/2014  T:  07/29/2014  Job:  [id number]
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
## 2014-07-29 NOTE — Progress Notes (Signed)
Subjective: Anna Anna Sutton this morning says that her hip hurts. She otherwise has no complaints. Ortho wanted cardiac clearance before proceeding with surgery.  Objective: Vital signs in last 24 hours: Filed Vitals:   07/29/14 0100 07/29/14 0500 07/29/14 0640 07/29/14 1303  BP:   125/46 139/66  Pulse:   61 135  Temp:   97.7 F (36.5 C) 99.4 F (37.4 C)  TempSrc:   Axillary Axillary  Resp: 16 20 16 16   Height: 5\' 5"  (1.651 m)     Weight: 112 lb (50.803 kg)     SpO2: 97% 95% 97% 93%   Weight change:   Intake/Output Summary (Last 24 hours) at 07/29/14 1338 Last data filed at 07/29/14 1300  Gross per 24 hour  Intake 1488.75 ml  Output    500 ml  Net 988.75 ml   Gen: No acute distress, well developed, well nourished HEENT: R periorbital ecchymosis, chipped teeth, PERRL, EOMI, sclerae anicteric, moist mucous membranes Heart: Regular rate and rhythm, normal S1 S2, no murmurs, rubs, or gallops Lungs: Clear to auscultation bilaterally anteriorly (could not move due to pain), respirations unlabored Abd: Soft, non-tender, non-distended, + bowel sounds, no hepatosplenomegaly Ext: No signs of trauma on R leg, 2+ DP and PT pulses, no edema or cyanosis  Neuro: A&O x 1 (self), CN II-XII intact, strength 5/5 on finger squeeze, foot dorsiflexion and plantarflexion, sensation grossly intact, no Babinkski sign b/l   Lab Results: Basic Metabolic Panel:  Recent Labs Lab 07/28/14 0742 07/29/14 0520  NA 140 137  K 3.8 3.8  CL 100 100  CO2 26 27  GLUCOSE 171* 133*  BUN 12 12  CREATININE 0.91 0.94  CALCIUM 8.9 8.6   Liver Function Tests:  Recent Labs Lab 07/28/14 0742 07/29/14 0520  AST 206* 226*  ALT 45* 169*  ALKPHOS 108 119*  BILITOT 0.9 1.4*  PROT 6.9 6.2  ALBUMIN 3.6 3.0*   CBC:  Recent Labs Lab 07/28/14 0742 07/29/14 0520  WBC 12.6* 12.2*  NEUTROABS 11.1*  --   HGB 11.9* 11.1*  HCT 37.2 35.2*  MCV 90.7 91.7  PLT 226 174   Cardiac Enzymes:  Recent Labs Lab  07/28/14 0742  CKTOTAL 67   CBG:  Recent Labs Lab 07/29/14 0648  GLUCAP 112*   Thyroid Function Tests:  Recent Labs Lab 07/28/14 1513  TSH 3.219   Coagulation:  Recent Labs Lab 07/28/14 0742 07/29/14 0520  LABPROT 16.9* 16.0*  INR 1.36 1.27   Urine Drug Screen: Drugs of Abuse     Component Value Date/Time   LABOPIA POSITIVE* 07/28/2014 0740   COCAINSCRNUR NONE DETECTED 07/28/2014 0740   LABBENZ NONE DETECTED 07/28/2014 0740   AMPHETMU NONE DETECTED 07/28/2014 0740   THCU NONE DETECTED 07/28/2014 0740   LABBARB NONE DETECTED 07/28/2014 0740  Urinalysis:  Recent Labs Lab 07/28/14 0740  COLORURINE YELLOW  LABSPEC 1.005  PHURINE 7.0  GLUCOSEU NEGATIVE  HGBUR NEGATIVE  BILIRUBINUR NEGATIVE  KETONESUR NEGATIVE  PROTEINUR NEGATIVE  UROBILINOGEN 0.2  NITRITE NEGATIVE  LEUKOCYTESUR SMALL*   Misc. Labs: Lactic acid 2.25 --> 1.2 istat troponin 0.01  Acute hepatitis panel negative  Micro Results: Recent Results (from the past 240 hour(s))  Surgical pcr screen     Status: Abnormal   Collection Time: 07/29/14  5:15 AM  Result Value Ref Range Status   MRSA, PCR NEGATIVE NEGATIVE Final   Staphylococcus aureus POSITIVE (A) NEGATIVE Final    Comment:        The  Xpert SA Assay (FDA approved for NASAL specimens in patients over 35 years of age), is one component of a comprehensive surveillance program.  Test performance has been validated by [facility] for patients greater than or equal to 65 year old. It is not intended to diagnose infection nor to guide or monitor treatment.    Studies/Results: Dg Chest 1 View  07/28/2014   CLINICAL DATA:  Pt fell this morning, seems to be very confused. She has right hip pain, and her foot is rotated inward. No known chest complaints Hx of paroxysmal atrail fibrillation, sick sinus syndrome, HTN  EXAM: CHEST  1 VIEW  COMPARISON:  12/14/2008  FINDINGS: Cardiac silhouette mildly enlarged. Normal mediastinal and hilar  contours.  Clear lungs.  No pleural effusion or pneumothorax.  Left anterior chest wall sequential pacemaker is stable and well positioned.  Bony thorax is intact.  IMPRESSION: No active disease.   Electronically Signed   By: Amie Portland M.D.   On: 07/28/2014 08:45   Ct Head Wo Contrast  07/28/2014   CLINICAL DATA:  RECENT FALL, PT. WITH RIGHT ORBITAL HEMATOMA-GREENISH/PURPLEISH IN COLOR, PT. UNABLE TO MOVE WITHOUT ASSISTANCE,  EXAM: CT HEAD WITHOUT CONTRAST  CT MAXILLOFACIAL WITHOUT CONTRAST  CT CERVICAL SPINE WITHOUT CONTRAST  TECHNIQUE: Multidetector CT imaging of the head, cervical spine, and maxillofacial structures were performed using the standard protocol without intravenous contrast. Multiplanar CT image reconstructions of the cervical spine and maxillofacial structures were also generated.  COMPARISON:  03/06/2014  FINDINGS: CT HEAD FINDINGS  Ventricles normal configuration. There is ventricular and sulcal enlargement reflecting mild to moderate atrophy. No hydrocephalus.  There are no parenchymal masses or mass effect. There is no evidence of a cortical infarct. Mild periventricular white matter hypoattenuation is noted, stable, consistent with chronic small-vessel ischemic change.  There are no extra-axial masses or abnormal fluid collections.  There is no intracranial hemorrhage.  No skull fracture.  CT MAXILLOFACIAL FINDINGS  No fracture. Small foci of mucosal thickening in each maxillary sinus. Sinuses otherwise clear. Clear mastoid air cells and middle ear cavities.  Globes orbits are unremarkable. No soft tissue masses. Mild right lateral periorbital soft tissue edema. Degenerative changes are noted of both temporomandibular joints.  CT CERVICAL SPINE FINDINGS  No fracture. No spondylolisthesis. There are mild disc degenerative changes from C3-C4 through C5-C6 with endplate spurring and mild disc bulging by without significant loss disc height. There is no significant central stenosis or neural  foraminal narrowing.  Soft tissues show carotid vascular calcifications but are otherwise unremarkable. Lung apices are clear.  IMPRESSION: HEAD CT:  No acute intracranial abnormalities.  No skull fracture.  MAXILLOFACIAL CT:  No fracture.  CERVICAL CT:  No fracture or acute finding.   Electronically Signed   By: Amie Portland M.D.   On: 07/28/2014 08:35   Ct Cervical Spine Wo Contrast  07/28/2014   CLINICAL DATA:  RECENT FALL, PT. WITH RIGHT ORBITAL HEMATOMA-GREENISH/PURPLEISH IN COLOR, PT. UNABLE TO MOVE WITHOUT ASSISTANCE,  EXAM: CT HEAD WITHOUT CONTRAST  CT MAXILLOFACIAL WITHOUT CONTRAST  CT CERVICAL SPINE WITHOUT CONTRAST  TECHNIQUE: Multidetector CT imaging of the head, cervical spine, and maxillofacial structures were performed using the standard protocol without intravenous contrast. Multiplanar CT image reconstructions of the cervical spine and maxillofacial structures were also generated.  COMPARISON:  03/06/2014  FINDINGS: CT HEAD FINDINGS  Ventricles normal configuration. There is ventricular and sulcal enlargement reflecting mild to moderate atrophy. No hydrocephalus.  There are no parenchymal masses or mass  effect. There is no evidence of a cortical infarct. Mild periventricular white matter hypoattenuation is noted, stable, consistent with chronic small-vessel ischemic change.  There are no extra-axial masses or abnormal fluid collections.  There is no intracranial hemorrhage.  No skull fracture.  CT MAXILLOFACIAL FINDINGS  No fracture. Small foci of mucosal thickening in each maxillary sinus. Sinuses otherwise clear. Clear mastoid air cells and middle ear cavities.  Globes orbits are unremarkable. No soft tissue masses. Mild right lateral periorbital soft tissue edema. Degenerative changes are noted of both temporomandibular joints.  CT CERVICAL SPINE FINDINGS  No fracture. No spondylolisthesis. There are mild disc degenerative changes from C3-C4 through C5-C6 with endplate spurring and mild disc  bulging by without significant loss disc height. There is no significant central stenosis or neural foraminal narrowing.  Soft tissues show carotid vascular calcifications but are otherwise unremarkable. Lung apices are clear.  IMPRESSION: HEAD CT:  No acute intracranial abnormalities.  No skull fracture.  MAXILLOFACIAL CT:  No fracture.  CERVICAL CT:  No fracture or acute finding.   Electronically Signed   By: Amie Portland M.D.   On: 07/28/2014 08:35   Dg Knee Right Port  07/28/2014   CLINICAL DATA:  Pt fell this morning, she has a known right femoral neck Fx. R/o right knee fx. She has pain all the way down her leg, starting at her hip.  EXAM: PORTABLE RIGHT KNEE - 1-2 VIEW  COMPARISON:  None.  FINDINGS: No acute fracture. Mild medial joint space compartment narrowing. Small marginal osteophytes noted from the medial lateral compartments. Bones are demineralized.  No joint effusion.  Soft tissues are unremarkable.  IMPRESSION: No fracture or dislocation or acute finding.  Mild osteoarthritis.   Electronically Signed   By: Amie Portland M.D.   On: 07/28/2014 10:40   Dg Hip Unilat With Pelvis 2-3 Views Right  07/28/2014   CLINICAL DATA:  Pt fell this morning, seems to be very confused. She has right hip pain, and her foot is rotated inward. No known chest complaints Hx of paroxysmal atrail fibrillation, sick sinus syndrome, HTN  EXAM: RIGHT HIP (WITH PELVIS) 2-3 VIEWS  COMPARISON:  None.  FINDINGS: There is a fracture of the right femoral neck. Fracture is mid cervical. There is a single comminuted fracture fragment along the superior margin of fracture. The shaft fracture component has migrated superiorly, displacing 2.5 cm in relation to femoral head neck component. There is mild varus angulation.  No other fractures. Hip joints are normally aligned. Bones are diffusely demineralized.  IMPRESSION: Displaced right femoral neck fracture as described.   Electronically Signed   By: Amie Portland M.D.   On:  07/28/2014 08:46   Ct Maxillofacial Wo Cm  07/28/2014   CLINICAL DATA:  RECENT FALL, PT. WITH RIGHT ORBITAL HEMATOMA-GREENISH/PURPLEISH IN COLOR, PT. UNABLE TO MOVE WITHOUT ASSISTANCE,  EXAM: CT HEAD WITHOUT CONTRAST  CT MAXILLOFACIAL WITHOUT CONTRAST  CT CERVICAL SPINE WITHOUT CONTRAST  TECHNIQUE: Multidetector CT imaging of the head, cervical spine, and maxillofacial structures were performed using the standard protocol without intravenous contrast. Multiplanar CT image reconstructions of the cervical spine and maxillofacial structures were also generated.  COMPARISON:  03/06/2014  FINDINGS: CT HEAD FINDINGS  Ventricles normal configuration. There is ventricular and sulcal enlargement reflecting mild to moderate atrophy. No hydrocephalus.  There are no parenchymal masses or mass effect. There is no evidence of a cortical infarct. Mild periventricular white matter hypoattenuation is noted, stable, consistent with chronic small-vessel ischemic change.  There are no extra-axial masses or abnormal fluid collections.  There is no intracranial hemorrhage.  No skull fracture.  CT MAXILLOFACIAL FINDINGS  No fracture. Small foci of mucosal thickening in each maxillary sinus. Sinuses otherwise clear. Clear mastoid air cells and middle ear cavities.  Globes orbits are unremarkable. No soft tissue masses. Mild right lateral periorbital soft tissue edema. Degenerative changes are noted of both temporomandibular joints.  CT CERVICAL SPINE FINDINGS  No fracture. No spondylolisthesis. There are mild disc degenerative changes from C3-C4 through C5-C6 with endplate spurring and mild disc bulging by without significant loss disc height. There is no significant central stenosis or neural foraminal narrowing.  Soft tissues show carotid vascular calcifications but are otherwise unremarkable. Lung apices are clear.  IMPRESSION: HEAD CT:  No acute intracranial abnormalities.  No skull fracture.  MAXILLOFACIAL CT:  No fracture.   CERVICAL CT:  No fracture or acute finding.   Electronically Signed   By: Amie Portland M.D.   On: 07/28/2014 08:35   US Abdomen Limited Ruq  07/29/2014   CLINICAL DATA:  Elevated transaminases.  Post cholecystectomy.  EXAM: US ABDOMEN LIMITED - RIGHT UPPER QUADRANT  COMPARISON:  None.  FINDINGS: Gallbladder:  Surgically absent  Common bile duct:  Diameter: 11 mm  Liver:  Mildly heterogeneous in echogenicity without focal lesion identified.  IMPRESSION: Common bile duct measures 11 mm in diameter. This is slightly greater than that expected for post cholecystectomy state. In the setting of abnormal laboratory values, consider correlation with MRCP.   Electronically Signed   By: Annia Belt M.D.   On: 07/29/2014 12:19   TTE: Study Conclusions  - Left ventricle: Systolic function was normal. The estimated ejection fraction was in the range of 60% to 65%. Wall motion was normal; there were no regional wall motion abnormalities. Left ventricular diastolic function parameters were normal. - Mitral valve: Calcified annulus. - Pulmonary arteries: Systolic pressure was mildly to moderately increased. PA peak pressure: 47 mm Hg (S).  Medications: I have reviewed the patient's current medications. Scheduled Meds: . antiseptic oral rinse  7 mL Mouth Rinse BID  . atenolol  12.5 mg Oral Daily  . diltiazem  120 mg Oral Daily  . donepezil  10 mg Oral Daily  . multivitamin with minerals  1 tablet Oral Daily  . [START ON 07/30/2014] pneumococcal 23 valent vaccine  0.5 mL Intramuscular Tomorrow-1000   Continuous Infusions:   PRN Meds:.sodium chloride irrigation Assessment/Plan: Principal Problem:   Hip fracture, right Active Problems:   Essential hypertension   Atrial fibrillation   SICK SINUS SYNDROME   PACEMAKER, PERMANENT   Repeated falls   Elevated transaminase level   Preop cardiovascular exam  #R femoral neck fracture: Anna Sutton has displaced R femoral neck fracture noted on hip  x-ray. This is traumatic secondary to fall. We have heled her xarelto since yesterday. Ortho wanted cardiac clearance before surgery. TTE today notable for EF 60-65%, no diastolic dysfunction, PA pressure 47 mm Hg. Cardiology says she is poor candidate for aggressive cardiac evaluation but proceed with R hip hemiarthroplasty for comfort and quality of life. Social work has begun Financial controller for SNF following surgery -appreciate ortho, cards, social work -R hip hemiarthroplasty per ortho -d/c norco 1-2 tab q4hprn due to elevated transaminases -morphine 1 mg iv q3hprn -strict bed rest  #Fall: Anna Sutton has had two falls in about the past two days. These have been witnessed by home health aid. She hit head on first fall and  has R periorbital ecchymosis but no fractures on CT. The fall this morning resulted in R femoral neck fracture. Although R knee pain on palpation for ortho, x-ray negative. Cause of falls unclear. It could be mechanical as they have occurred in the early morning and she has severe dementia. She could be dehydrated as she was dry on exam but cannot get orthostatics as she has too much pain to sit up so ran NS IVF overnight. PPM interrogated in ED and appears clear. TTE unrevealing other than mild to moderate PA pressure. Head CT without acute process but TIA possible. Seizure also considered as she was noted to have dry stool on EMS arrival but health health aid did not note seizure like activity. Also consider abuse in setting of severe dementia. -appreciate neuro, social work -EEG -d/c NS IVF  -neuro checks q4h -norco 1-2 tab q4hprn -telemetry -strict bed rest  #Paroxysmal atrial fibrillation: Anna Sutton is currently in normal sinus rhythm. She is followed by Dr Hillis Range of [facility] cardiology. Last seen 03/12/14 with no new recommendations. She is on atenolol 100 mg daily, cartia xt 120 mg daily, xarelto 15 mg daily. She was seen by cardiology for surgery clearance and they recommend  given her severe dementia, not continuing xarelto -cont atenolol 100 mg daily, cartiz xt 120 mg daily -d/c xarelto 15 mg daily  #Elevated transaminase: Anna Sutton has AST 206 with ALT 45. Her family denies any alcohol use. No abdominal imaging on EMR. Hepatitis panel negative. This morning elevated to 226 and 169 but abdominal exam benign. -RUQ u/s -stop norco with tylenol -cont to monitor  #Sick sinus syndrome s/p PPM: Anna Sutton is s/p permanent pacemaker secondary to sick sinus syndrome. She is followed by Dr Hillis Range of [facility] cardiology. Last seen 03/12/14 with normal pacemaker function and no changes. Interrogated in ED and appears normal. She is on cartia xt 120 mg daily. -cont cartia xt 120 mg daily  #HTN: BP 100s-150s/40s-60s. At home she is on atenolol 10 mg daily, cartiz xt 120 mg daily, atorvastatin 10 mg daily. -cont atenolol 10 mg daily, cartiz xt 120 mg daily, atorvastatin 10 mg daily  #HL: No lipid panel in our EMR. At home she is on atorvastatin 10 mg daily. -continue atorvastatin 10 mg daily  #Advanced Dementia: Anna Sutton is alert and oriented x 1 only to self. She could not answer simple review of system questions or engage in conversation. Her son said that this is her baseline. TSH wnl. He says she lives alone but has 24 hour home health aid. At home she is on donepezil 10 mg daily.  -cont donepezil 10 mg daily -consult social work  #Diet: NPO for surgery  #DVT PPx: SCDs  #Code: Full  Dispo: Disposition is deferred at this time, awaiting improvement of current medical problems.  Anticipated discharge in approximately 2 day(s).   The patient does have a current PCP (Anna Specking, MD) and does need an [facility] hospital follow-up appointment after discharge.  The patient does not have transportation limitations that hinder transportation to clinic appointments.  .Services Needed at time of discharge: Y = Yes, Blank = No PT:   OT:   RN:   Equipment:   Other:      LOS: 1 day   Lorenda Hatchet, MD 07/29/2014, 1:38 PM

## 2014-07-29 NOTE — Progress Notes (Signed)
PT Cancellation Note  Patient Details Name: Anna Sutton MRN: [id number] DOB: 06/04/1940   Cancelled Treatment:    Reason Eval/Treat Not Completed: Patient not medically ready    Orders Received, chart reviewed;  Noted likely for surgery for her hip fx today;  Will hold PT eval for today, and follow-up for PT eval postop;   Thanks,  Van ClinesHolly Shalise Rosado, PT  Acute Rehabilitation Services Pager [phone] Office [phone]    Van ClinesGarrigan, Hinton Luellen Will[facility]rest Hospitalamff 07/29/2014, 8:00 AM

## 2014-07-29 NOTE — Consult Note (Signed)
Reason for Consult: surgical clearance    Referring Physician: Dr. Ethelene Hal   PCP:  Glenda Chroman., MD  Primary Cardiologist:Dr. Newton Pigg is an 74 y.o. female.    Chief Complaint: pt admitted 07/28/14    HPI: 74 year old woman with advanced dementia, sick sinus syndrome s/p PPM-medtronic, paroxysmal atrial fibrillation on xarelto, HTN, HL here for fall. Ms Dolata lives alone but has 24 hour home health aid supervision. History was provided by son as Ms Duggin has severe dementia. Her son says that, per the home health aid who witnessed event,of fall yesterday AM.  She landed on her R side and did not hit her head. She was allegedly awake the entire time. She was noted by EMS to have some dried stool on her legs but son is unsure about any incontinence. He says that she had another fall late Wednesday 4/13 or early Thursday 4/14 and hit her head as her aid caught her. She was not medically evaluated. He does not think there has been anything else unusual about his mother and she has not been complaining about anything such as fevers or dysuria. In ER she had no complaints or pain but son notes she has had R hip pain after fall. Also of note, she was hospitalized about a year ago for syncope thought to be due to dehydration.  No chest pain, some SOB today.  Echo being done now.   In ER she was in SR borderline 1st degree block and no acute changes on EKG,  Troponin poc was negative. VS stable. She is on xarelto .  XRAYS with rt hip displaced femoral neck fracture.   WILL NEED RIGHT HIP HEMIARTHROPLASTY FOR COMFORT AND QUALITY OF LIFE.  CT head without acute abnormality.  EEG ordered.   Hx of GXT that was normal around 2010.   Since admit her LFTs have increased.  She is for abd u/s today along with echo.        Past Medical History  Diagnosis Date  . Paroxysmal atrial fibrillation   . Hyperlipidemia   . Hypertension   . Sick sinus syndrome     s/p PPM (MDT)    . Pacemaker     Past Surgical History  Procedure Laterality Date  . Pacemaker placement  12/13/08    MDT Adapta L implanted by Dr Rayann Heman    Family History  Problem Relation Age of Onset  . Heart failure     Social History:  reports that she has never smoked. She has never used smokeless tobacco. She reports that she does not drink alcohol or use illicit drugs.  Allergies: No Known Allergies  Medications Prior to Admission  Medication Sig Dispense Refill  . atenolol (TENORMIN) 100 MG tablet Take 1 tablet (100 mg total) by mouth daily. 30 tablet 6  . atorvastatin (LIPITOR) 10 MG tablet Take 10 mg by mouth daily.     Marland Kitchen CARTIA XT 120 MG 24 hr capsule TAKE 1 CAPSULE BY MOUTH ONCE A DAY. 30 capsule 6  . donepezil (ARICEPT) 10 MG tablet Take 10 mg by mouth daily.     . Multiple Vitamin (MULTIVITAMIN) tablet Take 1 tablet by mouth daily.      . Rivaroxaban (XARELTO) 15 MG TABS tablet Take 15 mg by mouth daily.    Marland Kitchen CARTIA XT 120 MG 24 hr capsule TAKE 1 CAPSULE BY MOUTH ONCE A DAY. 30 capsule 6  Results for orders placed or performed during the hospital encounter of 07/28/14 (from the past 48 hour(s))  Urinalysis, Routine w reflex microscopic     Status: Abnormal   Collection Time: 07/28/14  7:40 AM  Result Value Ref Range   Color, Urine YELLOW YELLOW   APPearance CLEAR CLEAR   Specific Gravity, Urine 1.005 1.005 - 1.030   pH 7.0 5.0 - 8.0   Glucose, UA NEGATIVE NEGATIVE mg/dL   Hgb urine dipstick NEGATIVE NEGATIVE   Bilirubin Urine NEGATIVE NEGATIVE   Ketones, ur NEGATIVE NEGATIVE mg/dL   Protein, ur NEGATIVE NEGATIVE mg/dL   Urobilinogen, UA 0.2 0.0 - 1.0 mg/dL   Nitrite NEGATIVE NEGATIVE   Leukocytes, UA SMALL (A) NEGATIVE  Urine microscopic-add on     Status: None   Collection Time: 07/28/14  7:40 AM  Result Value Ref Range   Squamous Epithelial / LPF RARE RARE   WBC, UA 3-6 <3 WBC/hpf  Urine rapid drug screen (hosp performed)     Status: Abnormal   Collection Time:  07/28/14  7:40 AM  Result Value Ref Range   Opiates POSITIVE (A) NONE DETECTED   Cocaine NONE DETECTED NONE DETECTED   Benzodiazepines NONE DETECTED NONE DETECTED   Amphetamines NONE DETECTED NONE DETECTED   Tetrahydrocannabinol NONE DETECTED NONE DETECTED   Barbiturates NONE DETECTED NONE DETECTED    Comment:        DRUG SCREEN FOR MEDICAL PURPOSES ONLY.  IF CONFIRMATION IS NEEDED FOR ANY PURPOSE, NOTIFY LAB WITHIN 5 DAYS.        LOWEST DETECTABLE LIMITS FOR URINE DRUG SCREEN Drug Class       Cutoff (ng/mL) Amphetamine      1000 Barbiturate      200 Benzodiazepine   902 Tricyclics       409 Opiates          300 Cocaine          300 THC              50   Comprehensive metabolic panel     Status: Abnormal   Collection Time: 07/28/14  7:42 AM  Result Value Ref Range   Sodium 140 135 - 145 mmol/L   Potassium 3.8 3.5 - 5.1 mmol/L   Chloride 100 96 - 112 mmol/L   CO2 26 19 - 32 mmol/L   Glucose, Bld 171 (H) 70 - 99 mg/dL   BUN 12 6 - 23 mg/dL   Creatinine, Ser 0.91 0.50 - 1.10 mg/dL   Calcium 8.9 8.4 - 10.5 mg/dL   Total Protein 6.9 6.0 - 8.3 g/dL   Albumin 3.6 3.5 - 5.2 g/dL   AST 206 (H) 0 - 37 U/L   ALT 45 (H) 0 - 35 U/L   Alkaline Phosphatase 108 39 - 117 U/L   Total Bilirubin 0.9 0.3 - 1.2 mg/dL   GFR calc non Af Amer 60 (L) >90 mL/min   GFR calc Af Amer 69 (L) >90 mL/min    Comment: (NOTE) The eGFR has been calculated using the CKD EPI equation. This calculation has not been validated in all clinical situations. eGFR's persistently <90 mL/min signify possible Chronic Kidney Disease.    Anion gap 14 5 - 15  CBC with Differential     Status: Abnormal   Collection Time: 07/28/14  7:42 AM  Result Value Ref Range   WBC 12.6 (H) 4.0 - 10.5 K/uL   RBC 4.10 3.87 - 5.11 MIL/uL   Hemoglobin 11.9 (  L) 12.0 - 15.0 g/dL   HCT 37.2 36.0 - 46.0 %   MCV 90.7 78.0 - 100.0 fL   MCH 29.0 26.0 - 34.0 pg   MCHC 32.0 30.0 - 36.0 g/dL   RDW 14.0 11.5 - 15.5 %   Platelets 226  150 - 400 K/uL   Neutrophils Relative % 88 (H) 43 - 77 %   Neutro Abs 11.1 (H) 1.7 - 7.7 K/uL   Lymphocytes Relative 6 (L) 12 - 46 %   Lymphs Abs 0.7 0.7 - 4.0 K/uL   Monocytes Relative 6 3 - 12 %   Monocytes Absolute 0.7 0.1 - 1.0 K/uL   Eosinophils Relative 0 0 - 5 %   Eosinophils Absolute 0.0 0.0 - 0.7 K/uL   Basophils Relative 0 0 - 1 %   Basophils Absolute 0.0 0.0 - 0.1 K/uL  Protime-INR     Status: Abnormal   Collection Time: 07/28/14  7:42 AM  Result Value Ref Range   Prothrombin Time 16.9 (H) 11.6 - 15.2 seconds   INR 1.36 0.00 - 1.49  CK     Status: None   Collection Time: 07/28/14  7:42 AM  Result Value Ref Range   Total CK 67 7 - 177 U/L  I-stat troponin, ED     Status: None   Collection Time: 07/28/14  7:56 AM  Result Value Ref Range   Troponin i, poc 0.01 0.00 - 0.08 ng/mL   Comment 3            Comment: Due to the release kinetics of cTnI, a negative result within the first hours of the onset of symptoms does not rule out myocardial infarction with certainty. If myocardial infarction is still suspected, repeat the test at appropriate intervals.   I-Stat CG4 Lactic Acid, ED     Status: Abnormal   Collection Time: 07/28/14  7:58 AM  Result Value Ref Range   Lactic Acid, Venous 2.25 (HH) 0.5 - 2.0 mmol/L   Comment NOTIFIED PHYSICIAN   TSH     Status: None   Collection Time: 07/28/14  3:13 PM  Result Value Ref Range   TSH 3.219 0.350 - 4.500 uIU/mL  Hepatitis panel, acute     Status: None   Collection Time: 07/28/14  3:13 PM  Result Value Ref Range   Hepatitis B Surface Ag NEGATIVE NEGATIVE   HCV Ab NEGATIVE NEGATIVE   Hep A IgM NON REACTIVE NON REACTIVE    Comment: (NOTE) Effective February 26, 2014, Hepatitis Acute Panel (test code [phone]) will be revised to automatically reflex to the Hepatitis C Viral RNA, Quantitative, Real-Time PCR assay if the Hepatitis C antibody screening result is Reactive. This action is being taken to ensure that the CDC/USPSTF  recommended HCV diagnostic algorithm with the appropriate test reflex needed for accurate interpretation is followed.    Hep B C IgM NON REACTIVE NON REACTIVE    Comment: (NOTE) High levels of Hepatitis B Core IgM antibody are detectable during the acute stage of Hepatitis B. This antibody is used to differentiate current from past HBV infection. Performed at [facility]   Comprehensive metabolic panel     Status: Abnormal   Collection Time: 07/29/14  5:20 AM  Result Value Ref Range   Sodium 137 135 - 145 mmol/L   Potassium 3.8 3.5 - 5.1 mmol/L   Chloride 100 96 - 112 mmol/L   CO2 27 19 - 32 mmol/L   Glucose, Bld 133 (H) 70 -  99 mg/dL   BUN 12 6 - 23 mg/dL   Creatinine, Ser 0.94 0.50 - 1.10 mg/dL   Calcium 8.6 8.4 - 10.5 mg/dL   Total Protein 6.2 6.0 - 8.3 g/dL   Albumin 3.0 (L) 3.5 - 5.2 g/dL   AST 226 (H) 0 - 37 U/L   ALT 169 (H) 0 - 35 U/L   Alkaline Phosphatase 119 (H) 39 - 117 U/L   Total Bilirubin 1.4 (H) 0.3 - 1.2 mg/dL   GFR calc non Af Amer 57 (L) >90 mL/min   GFR calc Af Amer 67 (L) >90 mL/min    Comment: (NOTE) The eGFR has been calculated using the CKD EPI equation. This calculation has not been validated in all clinical situations. eGFR's persistently <90 mL/min signify possible Chronic Kidney Disease.    Anion gap 10 5 - 15  Protime-INR     Status: Abnormal   Collection Time: 07/29/14  5:20 AM  Result Value Ref Range   Prothrombin Time 16.0 (H) 11.6 - 15.2 seconds   INR 1.27 0.00 - 1.49  APTT     Status: None   Collection Time: 07/29/14  5:20 AM  Result Value Ref Range   aPTT 32 24 - 37 seconds  CBC     Status: Abnormal   Collection Time: 07/29/14  5:20 AM  Result Value Ref Range   WBC 12.2 (H) 4.0 - 10.5 K/uL   RBC 3.84 (L) 3.87 - 5.11 MIL/uL   Hemoglobin 11.1 (L) 12.0 - 15.0 g/dL   HCT 35.2 (L) 36.0 - 46.0 %   MCV 91.7 78.0 - 100.0 fL   MCH 28.9 26.0 - 34.0 pg   MCHC 31.5 30.0 - 36.0 g/dL   RDW 14.2 11.5 - 15.5 %   Platelets 174 150 -  400 K/uL  Lactic acid, plasma     Status: None   Collection Time: 07/29/14  5:28 AM  Result Value Ref Range   Lactic Acid, Venous 1.2 0.5 - 2.0 mmol/L  Glucose, capillary     Status: Abnormal   Collection Time: 07/29/14  6:48 AM  Result Value Ref Range   Glucose-Capillary 112 (H) 70 - 99 mg/dL   Dg Chest 1 View  07/28/2014   CLINICAL DATA:  Pt fell this morning, seems to be very confused. She has right hip pain, and her foot is rotated inward. No known chest complaints Hx of paroxysmal atrail fibrillation, sick sinus syndrome, HTN  EXAM: CHEST  1 VIEW  COMPARISON:  12/14/2008  FINDINGS: Cardiac silhouette mildly enlarged. Normal mediastinal and hilar contours.  Clear lungs.  No pleural effusion or pneumothorax.  Left anterior chest wall sequential pacemaker is stable and well positioned.  Bony thorax is intact.  IMPRESSION: No active disease.   Electronically Signed   By: Lajean Manes M.D.   On: 07/28/2014 08:45   Ct Head Wo Contrast  07/28/2014   CLINICAL DATA:  RECENT FALL, PT. WITH RIGHT ORBITAL HEMATOMA-GREENISH/PURPLEISH IN COLOR, PT. UNABLE TO MOVE WITHOUT ASSISTANCE,  EXAM: CT HEAD WITHOUT CONTRAST  CT MAXILLOFACIAL WITHOUT CONTRAST  CT CERVICAL SPINE WITHOUT CONTRAST  TECHNIQUE: Multidetector CT imaging of the head, cervical spine, and maxillofacial structures were performed using the standard protocol without intravenous contrast. Multiplanar CT image reconstructions of the cervical spine and maxillofacial structures were also generated.  COMPARISON:  03/06/2014  FINDINGS: CT HEAD FINDINGS  Ventricles normal configuration. There is ventricular and sulcal enlargement reflecting mild to moderate atrophy. No hydrocephalus.  There are  no parenchymal masses or mass effect. There is no evidence of a cortical infarct. Mild periventricular white matter hypoattenuation is noted, stable, consistent with chronic small-vessel ischemic change.  There are no extra-axial masses or abnormal fluid  collections.  There is no intracranial hemorrhage.  No skull fracture.  CT MAXILLOFACIAL FINDINGS  No fracture. Small foci of mucosal thickening in each maxillary sinus. Sinuses otherwise clear. Clear mastoid air cells and middle ear cavities.  Globes orbits are unremarkable. No soft tissue masses. Mild right lateral periorbital soft tissue edema. Degenerative changes are noted of both temporomandibular joints.  CT CERVICAL SPINE FINDINGS  No fracture. No spondylolisthesis. There are mild disc degenerative changes from C3-C4 through C5-C6 with endplate spurring and mild disc bulging by without significant loss disc height. There is no significant central stenosis or neural foraminal narrowing.  Soft tissues show carotid vascular calcifications but are otherwise unremarkable. Lung apices are clear.  IMPRESSION: HEAD CT:  No acute intracranial abnormalities.  No skull fracture.  MAXILLOFACIAL CT:  No fracture.  CERVICAL CT:  No fracture or acute finding.   Electronically Signed   By: Lajean Manes M.D.   On: 07/28/2014 08:35   Ct Cervical Spine Wo Contrast  07/28/2014   CLINICAL DATA:  RECENT FALL, PT. WITH RIGHT ORBITAL HEMATOMA-GREENISH/PURPLEISH IN COLOR, PT. UNABLE TO MOVE WITHOUT ASSISTANCE,  EXAM: CT HEAD WITHOUT CONTRAST  CT MAXILLOFACIAL WITHOUT CONTRAST  CT CERVICAL SPINE WITHOUT CONTRAST  TECHNIQUE: Multidetector CT imaging of the head, cervical spine, and maxillofacial structures were performed using the standard protocol without intravenous contrast. Multiplanar CT image reconstructions of the cervical spine and maxillofacial structures were also generated.  COMPARISON:  03/06/2014  FINDINGS: CT HEAD FINDINGS  Ventricles normal configuration. There is ventricular and sulcal enlargement reflecting mild to moderate atrophy. No hydrocephalus.  There are no parenchymal masses or mass effect. There is no evidence of a cortical infarct. Mild periventricular white matter hypoattenuation is noted, stable,  consistent with chronic small-vessel ischemic change.  There are no extra-axial masses or abnormal fluid collections.  There is no intracranial hemorrhage.  No skull fracture.  CT MAXILLOFACIAL FINDINGS  No fracture. Small foci of mucosal thickening in each maxillary sinus. Sinuses otherwise clear. Clear mastoid air cells and middle ear cavities.  Globes orbits are unremarkable. No soft tissue masses. Mild right lateral periorbital soft tissue edema. Degenerative changes are noted of both temporomandibular joints.  CT CERVICAL SPINE FINDINGS  No fracture. No spondylolisthesis. There are mild disc degenerative changes from C3-C4 through C5-C6 with endplate spurring and mild disc bulging by without significant loss disc height. There is no significant central stenosis or neural foraminal narrowing.  Soft tissues show carotid vascular calcifications but are otherwise unremarkable. Lung apices are clear.  IMPRESSION: HEAD CT:  No acute intracranial abnormalities.  No skull fracture.  MAXILLOFACIAL CT:  No fracture.  CERVICAL CT:  No fracture or acute finding.   Electronically Signed   By: Lajean Manes M.D.   On: 07/28/2014 08:35   Dg Knee Right Port  07/28/2014   CLINICAL DATA:  Pt fell this morning, she has a known right femoral neck Fx. R/o right knee fx. She has pain all the way down her leg, starting at her hip.  EXAM: PORTABLE RIGHT KNEE - 1-2 VIEW  COMPARISON:  None.  FINDINGS: No acute fracture. Mild medial joint space compartment narrowing. Small marginal osteophytes noted from the medial lateral compartments. Bones are demineralized.  No joint effusion.  Soft tissues are  unremarkable.  IMPRESSION: No fracture or dislocation or acute finding.  Mild osteoarthritis.   Electronically Signed   By: Lajean Manes M.D.   On: 07/28/2014 10:40   Dg Hip Unilat With Pelvis 2-3 Views Right  07/28/2014   CLINICAL DATA:  Pt fell this morning, seems to be very confused. She has right hip pain, and her foot is rotated  inward. No known chest complaints Hx of paroxysmal atrail fibrillation, sick sinus syndrome, HTN  EXAM: RIGHT HIP (WITH PELVIS) 2-3 VIEWS  COMPARISON:  None.  FINDINGS: There is a fracture of the right femoral neck. Fracture is mid cervical. There is a single comminuted fracture fragment along the superior margin of fracture. The shaft fracture component has migrated superiorly, displacing 2.5 cm in relation to femoral head neck component. There is mild varus angulation.  No other fractures. Hip joints are normally aligned. Bones are diffusely demineralized.  IMPRESSION: Displaced right femoral neck fracture as described.   Electronically Signed   By: Lajean Manes M.D.   On: 07/28/2014 08:46   Ct Maxillofacial Wo Cm  07/28/2014   CLINICAL DATA:  RECENT FALL, PT. WITH RIGHT ORBITAL HEMATOMA-GREENISH/PURPLEISH IN COLOR, PT. UNABLE TO MOVE WITHOUT ASSISTANCE,  EXAM: CT HEAD WITHOUT CONTRAST  CT MAXILLOFACIAL WITHOUT CONTRAST  CT CERVICAL SPINE WITHOUT CONTRAST  TECHNIQUE: Multidetector CT imaging of the head, cervical spine, and maxillofacial structures were performed using the standard protocol without intravenous contrast. Multiplanar CT image reconstructions of the cervical spine and maxillofacial structures were also generated.  COMPARISON:  03/06/2014  FINDINGS: CT HEAD FINDINGS  Ventricles normal configuration. There is ventricular and sulcal enlargement reflecting mild to moderate atrophy. No hydrocephalus.  There are no parenchymal masses or mass effect. There is no evidence of a cortical infarct. Mild periventricular white matter hypoattenuation is noted, stable, consistent with chronic small-vessel ischemic change.  There are no extra-axial masses or abnormal fluid collections.  There is no intracranial hemorrhage.  No skull fracture.  CT MAXILLOFACIAL FINDINGS  No fracture. Small foci of mucosal thickening in each maxillary sinus. Sinuses otherwise clear. Clear mastoid air cells and middle ear cavities.   Globes orbits are unremarkable. No soft tissue masses. Mild right lateral periorbital soft tissue edema. Degenerative changes are noted of both temporomandibular joints.  CT CERVICAL SPINE FINDINGS  No fracture. No spondylolisthesis. There are mild disc degenerative changes from C3-C4 through C5-C6 with endplate spurring and mild disc bulging by without significant loss disc height. There is no significant central stenosis or neural foraminal narrowing.  Soft tissues show carotid vascular calcifications but are otherwise unremarkable. Lung apices are clear.  IMPRESSION: HEAD CT:  No acute intracranial abnormalities.  No skull fracture.  MAXILLOFACIAL CT:  No fracture.  CERVICAL CT:  No fracture or acute finding.   Electronically Signed   By: Lajean Manes M.D.   On: 07/28/2014 08:35   Difficult for hx with pt's dementia, these are from previous notes and pt to an extent ROS: General:no colds or fevers, + weight changes from 2014 Skin:no rashes or ulcers HEENT:no blurred vision, no congestion, black eye from fall on [place]  CV:see HPI PUL:see HPI GI:no diarrhea constipation or melena, no indigestion GU:no hematuria, no dysuria MS:no joint pain, no claudication Neuro:no syncope, no lightheadedness- sitter with pt with fall and no syncope.   Endo:no diabetes, no thyroid disease   Blood pressure 125/46, pulse 61, temperature 97.7 F (36.5 C), temperature source Axillary, resp. rate 16, height 5' 5" (1.651 m), weight 112  lb (50.803 kg), SpO2 97 %.  Wt Readings from Last 3 Encounters:  07/29/14 112 lb (50.803 kg)  03/12/14 112 lb (50.803 kg)  12/22/12 152 lb (68.947 kg)    PE: General:Pleasant affect, NAD, pulls at echo wand Skin:Warm and dry, brisk capillary refill HEENT:normocephalic, sclera clear, mucus membranes moist, rt eye ecchymosis of orbit Neck:supple, no JVD, no bruits  Heart:S1S2 RRR without murmur, gallup, rub or click Lungs:clear without rales, rhonchi, or wheezes CNO:BSJG, non  tender, + BS, do not palpate liver spleen or masses Ext:no lower ext edema, 2+ pedal pulses, 2+ radial pulses Neuro:alert and oriented, MAE, follows commands, + facial symmetry    Assessment/Plan Principal Problem:   Hip fracture, right- xarelto on hold for surgery tomorrow last dose on the 15th, CHA2DS2VASc score = 4- may need to stop anticoagulation with freq falls.  Active Problems:   Essential hypertension- controlled   Atrial fibrillation- maintaining SR    SICK SINUS SYNDROME   PACEMAKER, PERMANENT- medtronic device, will have interrogated for possible arrhythmia    Repeated falls   Elevated transaminase level per IM, abd u/x ordered.  She lives at home with round the clock sitters.  Her son is POA.  Universal Practitioner Certified [place] Pager [phone] or after 5pm or weekends call [phone] 07/29/2014, 10:20 AM  As above, patient seen and examined. Briefly she is a 74 year old female with a past medical history of severe dementia, paroxysmal atrial fibrillation, prior pacemaker, hypertension who is status post fall with hip fracture who we are asked to evaluate preoperatively. Patient has severe dementia and cannot provide a history of chest pain or dyspnea. She is not oriented to person place or time. ECG shows sinus with no ST changes. Patient is not a good candidate for aggressive cardiac evaluation given severity of dementia. I would proceed with surgical repair of her hip fracture realizing there is some risk from a cardiac standpoint. Given her dementia and recurrent falls I do not think she is a good long-term anticoagulation candidate. I would discontinue xarelto long-term and instead treat with aspirin. I discussed this with the patient's sister. I explained there is a higher risk of stroke but I feel the risk of anticoagulation outweighs the benefit. Continue Cardizem and beta blocker for history of atrial fibrillation. Would have  pacemaker interrogated; ? Arrhythmia contributing to falls. Kirk Ruths

## 2014-07-29 NOTE — Anesthesia Procedure Notes (Signed)
Procedure Name: Intubation Date/Time: 07/29/2014 2:19 PM Performed by: Fransisca KaufmannMEYER, Nakeem Murnane E Pre-anesthesia Checklist: Patient identified, Emergency Drugs available, Suction available and Patient being monitored Patient Re-evaluated:Patient Re-evaluated prior to inductionOxygen Delivery Method: Circle system utilized Preoxygenation: Pre-oxygenation with 100% oxygen Intubation Type: IV induction Ventilation: Mask ventilation without difficulty Laryngoscope Size: Glidescope (attempted with Miller 2, Mac 3, followed by glidescope x 1) Grade View: Grade III Tube type: Oral Tube size: 7.5 mm Number of attempts: 3 Airway Equipment and Method: Stylet Placement Confirmation: ETT inserted through vocal cords under direct vision,  positive ETCO2 and CO2 detector Secured at: 22 cm Tube secured with: Tape Dental Injury: Teeth and Oropharynx as per pre-operative assessment  Difficulty Due To: Difficulty was anticipated Comments: Poor dentition, sharp edges on most teeth/extremely poor repair

## 2014-07-30 ENCOUNTER — Inpatient Hospital Stay (HOSPITAL_COMMUNITY): Payer: Medicare Other

## 2014-07-30 ENCOUNTER — Encounter (HOSPITAL_COMMUNITY): Payer: Self-pay | Admitting: Orthopaedic Surgery

## 2014-07-30 DIAGNOSIS — R296 Repeated falls: Secondary | ICD-10-CM

## 2014-07-30 LAB — COMPREHENSIVE METABOLIC PANEL
ALT: 69 U/L — ABNORMAL HIGH (ref 0–35)
AST: 53 U/L — AB (ref 0–37)
Albumin: 2.4 g/dL — ABNORMAL LOW (ref 3.5–5.2)
Alkaline Phosphatase: 86 U/L (ref 39–117)
Anion gap: 9 (ref 5–15)
BILIRUBIN TOTAL: 1.2 mg/dL (ref 0.3–1.2)
BUN: 9 mg/dL (ref 6–23)
CALCIUM: 8 mg/dL — AB (ref 8.4–10.5)
CO2: 27 mmol/L (ref 19–32)
CREATININE: 0.84 mg/dL (ref 0.50–1.10)
Chloride: 98 mmol/L (ref 96–112)
GFR calc Af Amer: 76 mL/min — ABNORMAL LOW (ref >90)
GFR, EST NON AFRICAN AMERICAN: 66 mL/min — AB (ref >90)
GLUCOSE: 138 mg/dL — AB (ref 70–99)
Potassium: 3.6 mmol/L (ref 3.5–5.1)
Sodium: 134 mmol/L — ABNORMAL LOW (ref 135–145)
Total Protein: 5.3 g/dL — ABNORMAL LOW (ref 6.0–8.3)

## 2014-07-30 LAB — CBC
HEMATOCRIT: 29.7 % — AB (ref 36.0–46.0)
HEMOGLOBIN: 9.4 g/dL — AB (ref 12.0–15.0)
MCH: 29.2 pg (ref 26.0–34.0)
MCHC: 31.6 g/dL (ref 30.0–36.0)
MCV: 92.2 fL (ref 78.0–100.0)
PLATELETS: 159 10*3/uL (ref 150–400)
RBC: 3.22 MIL/uL — ABNORMAL LOW (ref 3.87–5.11)
RDW: 14.3 % (ref 11.5–15.5)
WBC: 10.3 10*3/uL (ref 4.0–10.5)

## 2014-07-30 LAB — GLUCOSE, CAPILLARY: Glucose-Capillary: 137 mg/dL — ABNORMAL HIGH (ref 70–99)

## 2014-07-30 LAB — VITAMIN D 25 HYDROXY (VIT D DEFICIENCY, FRACTURES): Vit D, 25-Hydroxy: 36.3 ng/mL (ref 30.0–100.0)

## 2014-07-30 MED ORDER — HYDROCODONE-ACETAMINOPHEN 5-325 MG PO TABS: 1.0000 | ORAL_TABLET | ORAL | Status: DC | PRN

## 2014-07-30 MED ORDER — HEPARIN SODIUM (PORCINE) 5000 UNIT/ML IJ SOLN
5000.0000 [IU] | Freq: Three times a day (TID) | INTRAMUSCULAR | Status: DC
Start: 2014-07-30 — End: 2014-07-31
  Administered 2014-07-30 – 2014-07-31 (×3): 5000 [IU] via SUBCUTANEOUS
  Filled 2014-07-30 (×3): qty 1

## 2014-07-30 MED ORDER — OXYCODONE HCL 5 MG PO TABS
5.0000 mg | ORAL_TABLET | ORAL | Status: DC | PRN
  Administered 2014-07-30 – 2014-07-31 (×3): 5 mg via ORAL
  Filled 2014-07-30 (×3): qty 1

## 2014-07-30 MED ORDER — ASPIRIN 325 MG PO TBEC: 325.0000 mg | DELAYED_RELEASE_TABLET | Freq: Every day | ORAL | Status: DC

## 2014-07-30 NOTE — Clinical Social Work Note (Signed)
Patient's son, Levora AngelSteve Shoults, [facility].  CSW to continue to follow and assist with discharge planning needs as patient becomes medically stable for discharge.  Marcelline DeistEmily Marsden Zaino, [place] Cell: [phone]       Fax: [phone] Clinical Social Work: Orthopedics [phone]([id number]) and Surgical [phone]([id number])

## 2014-07-30 NOTE — Progress Notes (Signed)
Utilization review completed.  

## 2014-07-30 NOTE — Progress Notes (Signed)
OT NOTE Pt is  current D/C plan is SNF [facility]. No apparent immediate acute care OT needs, therefore will defer OT to SNF. If OT eval is needed please call Acute Rehab Dept. at [phone] or text page OT at [phone].    Mateo FlowJones, Brynn   OTR/L Pager: [phone] Office: [phone] .

## 2014-07-30 NOTE — Procedures (Signed)
History: 74 yo F with falls.   Sedation: None  Technique: This is a 17 channel routine scalp EEG performed at the bedside with bipolar and monopolar montages arranged in accordance to the international 10/20 system of electrode placement. One channel was dedicated to EKG recording.    Background: There is some generalized irregular delta activity even during periods of maximal wakefulness. There is a well defined posterior dominant rhythm of  Hz that attenuates with eye opening. There are frequent sharp waves that at times appear bi occipitally and at times appears to have a right posterior quadrant field. I suspect that this represents a right occipital focus with fast spread across the posterior commissure at times.   Photic stimulation: Physiologic driving is not performed  EEG Abnormalities: 1) Right parieto-occipital sharp waves. 2) Generalized irregular delta activity.   Clinical Interpretation: This EEG is consistent with a right parieto-occipital area of potential epileptogenicity. There was no seizure  recorded on this study.   Ritta SlotMcNeill Greggory Safranek, MD [facility] [phone]  If 7pm- 7am, please page neurology on call as listed in AMION.

## 2014-07-30 NOTE — Progress Notes (Signed)
Subjective: No overnight events. Currently resting comfortably. Son feels she is back at her baseline.   Objective: Current vital signs: BP 113/56 mmHg  Pulse 95  Temp(Src) 98.9 F (37.2 C) (Oral)  Resp 18  Ht  (1.651 m)  Wt 50.803 kg (112 lb)  BMI 18.64 kg/m2  SpO2 98% Vital signs in last 24 hours: Temp:  [97.9 F (36.6 C)-100.1 F (37.8 C)] 98.9 F (37.2 C) (04/18 0346) Pulse Rate:  [60-135] 95 (04/18 0425) Resp:  [11-22] 18 (04/18 0425) BP: (110-139)/(50-83) 113/56 mmHg (04/18 0425) SpO2:  [92 %-100 %] 98 % (04/18 0425)  Intake/Output from previous day: 04/17 0701 - 04/18 0700 In: 1720 [P.O.:120; I.V.:1600] Out: 1650 [Urine:1650] Intake/Output this shift: Total I/O In: 240 [P.O.:240] Out: -  Nutritional status: Diet Heart Room service appropriate?: Yes; Fluid consistency:: Thin  Neurologic Exam: Alert, but very confused. (son states it is her baseline) Speech fluent without evidence of aphasia.  Cranial Nerves: II:  pupils equal, round, reactive to light and accommodation III,IV, VI: ptosis not present, extra-ocular motions intact bilaterally V,VII: smile symmetric, facial light touch sensation normal bilaterally Motor: Patient able to move all extremities against gravity except the right lower extremity which was not tested secondary to her displaced femur fracture. She was able to flex and dorsiflex her foot on the right. Tone and bulk:normal tone throughout; mild atrophy noted Lab Results: Basic Metabolic Panel:  Recent Labs Lab 07/28/14 0742 07/29/14 0520 07/30/14 0540  NA 140 137 134*  K 3.8 3.8 3.6  CL 100 100 98  CO2 GLUCOSE 171* 133* 138*  BUN CREATININE 0.91 0.94 0.84  CALCIUM 8.9 8.6 8.0*    Liver Function Tests:  Recent Labs Lab 07/28/14 0742 07/29/14 0520 07/30/14 0540  AST 206* 226* 53*  ALT 45* 169* 69*  ALKPHOS 108 119* 86  BILITOT 0.9 1.4* 1.2  PROT 6.9 6.2 5.3*  ALBUMIN 3.6 3.0* 2.4*   No results  for input(s): LIPASE, AMYLASE in the last 168 hours. No results for input(s): AMMONIA in the last 168 hours.  CBC:  Recent Labs Lab 07/28/14 0742 07/29/14 0520 07/30/14 0540  WBC 12.6* 12.2* 10.3  NEUTROABS 11.1*  --   --   HGB 11.9* 11.1* 9.4*  HCT 37.2 35.2* 29.7*  MCV 90.7 91.7 92.2  PLT 226 174 159    Cardiac Enzymes:  Recent Labs Lab 07/28/14 0742  CKTOTAL 67    Lipid Panel: No results for input(s): CHOL, TRIG, HDL, CHOLHDL, VLDL, LDLCALC in the last 168 hours.  CBG:  Recent Labs Lab 07/29/14 0648 07/30/14 0553  GLUCAP 112* 137*    Microbiology: Results for orders placed or performed during the hospital encounter of 07/28/14  Urine culture     Status: None (Preliminary result)   Collection Time: 07/28/14  7:40 AM  Result Value Ref Range Status   Specimen Description URINE, CATHETERIZED  Final   Special Requests NONE  Final   Colony Count NO GROWTH Performed at [facility]   Final   Culture NO GROWTH Performed at [facility]   Final   Report Status PENDING  Incomplete  Surgical pcr screen     Status: Abnormal   Collection Time: 07/29/14  5:15 AM  Result Value Ref Range Status   MRSA, PCR NEGATIVE NEGATIVE Final   Staphylococcus aureus POSITIVE (A) NEGATIVE Final    Comment:        The Xpert SA  Assay (FDA approved for NASAL specimens in patients over 76 years of age), is one component of a comprehensive surveillance program.  Test performance has been validated by [facility] for patients greater than or equal to 76 year old. It is not intended to diagnose infection nor to guide or monitor treatment.     Coagulation Studies:  Recent Labs  07/28/14 0742 07/29/14 0520  LABPROT 16.9* 16.0*  INR 1.36 1.27    Imaging: Pelvis Portable  07/29/2014   CLINICAL DATA:  Status post right hip replacement  EXAM: PORTABLE PELVIS 1-2 VIEWS  COMPARISON:  None.  FINDINGS: A right hip replacement is seen. Pelvic ring is intact. No  acute bony abnormality is noted. No soft tissue changes are seen.  IMPRESSION: Status post right hip replacement   Electronically Signed   By: Alcide CleverMark  Lukens M.D.   On: 07/29/2014 17:09   [place] Abdomen Limited Ruq  07/29/2014   CLINICAL DATA:  Elevated transaminases.  Post cholecystectomy.  EXAM: US ABDOMEN LIMITED - RIGHT UPPER QUADRANT  COMPARISON:  None.  FINDINGS: Gallbladder:  Surgically absent  Common bile duct:  Diameter: 11 mm  Liver:  Mildly heterogeneous in echogenicity without focal lesion identified.  IMPRESSION: Common bile duct measures 11 mm in diameter. This is slightly greater than that expected for post cholecystectomy state. In the setting of abnormal laboratory values, consider correlation with MRCP.   Electronically Signed   By: Annia Beltrew  Davis M.D.   On: 07/29/2014 12:19    Medications:  Scheduled: . antiseptic oral rinse  7 mL Mouth Rinse BID  . aspirin EC  325 mg Oral Q breakfast  . atenolol  12.5 mg Oral Daily  . diltiazem  120 mg Oral Daily  . donepezil  10 mg Oral Daily  . heparin subcutaneous  5,000 Units Subcutaneous 3 times per day  . multivitamin with minerals  1 tablet Oral Daily  . pneumococcal 23 valent vaccine  0.5 mL Intramuscular Tomorrow-1000    Assessment/Plan: Pleasant 74 year old female with 2 recent falls one of which was witnessed. No frank seizure activity observed. No loss of consciousness. No postictal symptoms described. No previous seizure history. No convincing evidence of seizure activity at this time with normal neurologic exam other than advanced dementia.  Currently back to baseline.  -will check EEG (read pending). If unremarkable no further neurological workup at this time.     LOS: 2 days   Elspeth Choeter Xzayvion Vaeth, DO [facility] [phone]  If 7pm- 7am, please page neurology on call as listed in AMION. 07/30/2014  10:59 AM

## 2014-07-30 NOTE — Progress Notes (Signed)
Patient ID: Anna Sutton, female   DOB: 06/09/1940, 74 y.o.   MRN: [id number] No acute changes overnight.  Performed a right hip hemiarthroplasty yesterday.  Cardiology recommended just aspirin at this point and stopping Xarelto to to her significant fall risk.  Pt and OT have been ordered.  She will need SNF placement post-hospitalization.

## 2014-07-30 NOTE — Discharge Instructions (Signed)
Ice as needed for right hip swelling. Can get incision wet in the shower beginning 08/05/14. New dry dressing to right hip daily as needed.

## 2014-07-30 NOTE — Progress Notes (Signed)
EEG completed; results pending.    

## 2014-07-30 NOTE — Progress Notes (Signed)
Subjective: Ms Zavadil underwent R hip hemiarthroplasty yesterday with no complications. This morning, she says that her pain is well controlled. She has no complaints. Her son was at bedside and he says he is in communications with the Child psychotherapist regarding placement for Ms Wearing.  Objective: Vital signs in last 24 hours: Filed Vitals:   07/30/14 0017 07/30/14 0342 07/30/14 0346 07/30/14 0425  BP: 123/50   113/56  Pulse: 61   95  Temp: 100.1 F (37.8 C)  98.9 F (37.2 C)   TempSrc: Oral  Oral   Resp: Height:      Weight:      SpO2: 100% 98%  98%   Weight change:   Intake/Output Summary (Last 24 hours) at 07/30/14 1056 Last data filed at 07/30/14 0900  Gross per 24 hour  Intake   1960 ml  Output   1650 ml  Net    310 ml   Gen: No acute distress, well developed, well nourished HEENT: R periorbital ecchymosis, chipped teeth, PERRL, EOMI, sclerae anicteric, moist mucous membranes Heart: Regular rate and rhythm, normal S1 S2, no murmurs, rubs, or gallops Lungs: Clear to auscultation bilaterally anteriorly, respirations unlabored Abd: Soft, non-tender, non-distended, + bowel sounds, no hepatosplenomegaly Ext: Well bandaged incision on R hip, 2+ DP and PT pulses, no edema or cyanosis  Neuro: A&O x 1 (self), CN II-XII intact, strength 5/5 on finger squeeze, foot dorsiflexion and plantarflexion, sensation grossly intact, no Babinkski sign b/l   Lab Results: Basic Metabolic Panel:  Recent Labs Lab 07/29/14 0520 07/30/14 0540  NA 137 134*  K 3.8 3.6  CL 100 98  CO2 27 27  GLUCOSE 133* 138*  BUN 12 9  CREATININE 0.94 0.84  CALCIUM 8.6 8.0*   Liver Function Tests:  Recent Labs Lab 07/29/14 0520 07/30/14 0540  AST 226* 53*  ALT 169* 69*  ALKPHOS 119* 86  BILITOT 1.4* 1.2  PROT 6.2 5.3*  ALBUMIN 3.0* 2.4*   CBC:  Recent Labs Lab 07/28/14 0742 07/29/14 0520 07/30/14 0540  WBC 12.6* 12.2* 10.3  NEUTROABS 11.1*  --   --   HGB 11.9* 11.1* 9.4*   HCT 37.2 35.2* 29.7*  MCV 90.7 91.7 92.2  PLT 226 174 159   Cardiac Enzymes:  Recent Labs Lab 07/28/14 0742  CKTOTAL 67   CBG:  Recent Labs Lab 07/29/14 0648 07/30/14 0553  GLUCAP 112* 137*   Thyroid Function Tests:  Recent Labs Lab 07/28/14 1513  TSH 3.219   Coagulation:  Recent Labs Lab 07/28/14 0742 07/29/14 0520  LABPROT 16.9* 16.0*  INR 1.36 1.27   Urine Drug Screen: Drugs of Abuse     Component Value Date/Time   LABOPIA POSITIVE* 07/28/2014 0740   COCAINSCRNUR NONE DETECTED 07/28/2014 0740   LABBENZ NONE DETECTED 07/28/2014 0740   AMPHETMU NONE DETECTED 07/28/2014 0740   THCU NONE DETECTED 07/28/2014 0740   LABBARB NONE DETECTED 07/28/2014 0740  Urinalysis:  Recent Labs Lab 07/28/14 0740  COLORURINE YELLOW  LABSPEC 1.005  PHURINE 7.0  GLUCOSEU NEGATIVE  HGBUR NEGATIVE  BILIRUBINUR NEGATIVE  KETONESUR NEGATIVE  PROTEINUR NEGATIVE  UROBILINOGEN 0.2  NITRITE NEGATIVE  LEUKOCYTESUR SMALL*   Misc. Labs: Lactic acid 2.25 --> 1.2 istat troponin 0.01  Acute hepatitis panel negative  Micro Results: Recent Results (from the past 240 hour(s))  Urine culture     Status: None (Preliminary result)   Collection Time: 07/28/14  7:40 AM  Result Value Ref Range  Status   Specimen Description URINE, CATHETERIZED  Final   Special Requests NONE  Final   Colony Count NO GROWTH Performed at [facility]   Final   Culture NO GROWTH Performed at [facility]   Final   Report Status PENDING  Incomplete  Surgical pcr screen     Status: Abnormal   Collection Time: 07/29/14  5:15 AM  Result Value Ref Range Status   MRSA, PCR NEGATIVE NEGATIVE Final   Staphylococcus aureus POSITIVE (A) NEGATIVE Final    Comment:        The Xpert SA Assay (FDA approved for NASAL specimens in patients over 47 years of age), is one component of a comprehensive surveillance program.  Test performance has been validated by [facility] for patients  greater than or equal to 6 year old. It is not intended to diagnose infection nor to guide or monitor treatment.    Studies/Results: Pelvis Portable  07/29/2014   CLINICAL DATA:  Status post right hip replacement  EXAM: PORTABLE PELVIS 1-2 VIEWS  COMPARISON:  None.  FINDINGS: A right hip replacement is seen. Pelvic ring is intact. No acute bony abnormality is noted. No soft tissue changes are seen.  IMPRESSION: Status post right hip replacement   Electronically Signed   By: Alcide Clever M.D.   On: 07/29/2014 17:09   US Abdomen Limited Ruq  07/29/2014   CLINICAL DATA:  Elevated transaminases.  Post cholecystectomy.  EXAM: US ABDOMEN LIMITED - RIGHT UPPER QUADRANT  COMPARISON:  None.  FINDINGS: Gallbladder:  Surgically absent  Common bile duct:  Diameter: 11 mm  Liver:  Mildly heterogeneous in echogenicity without focal lesion identified.  IMPRESSION: Common bile duct measures 11 mm in diameter. This is slightly greater than that expected for post cholecystectomy state. In the setting of abnormal laboratory values, consider correlation with MRCP.   Electronically Signed   By: Annia Belt M.D.   On: 07/29/2014 12:19   TTE: Study Conclusions  - Left ventricle: Systolic function was normal. The estimated ejection fraction was in the range of 60% to 65%. Wall motion was normal; there were no regional wall motion abnormalities. Left ventricular diastolic function parameters were normal. - Mitral valve: Calcified annulus. - Pulmonary arteries: Systolic pressure was mildly to moderately increased. PA peak pressure: 47 mm Hg (S).  Medications: I have reviewed the patient's current medications. Scheduled Meds: . antiseptic oral rinse  7 mL Mouth Rinse BID  . aspirin EC  325 mg Oral Q breakfast  . atenolol  12.5 mg Oral Daily  . diltiazem  120 mg Oral Daily  . donepezil  10 mg Oral Daily  . heparin subcutaneous  5,000 Units Subcutaneous 3 times per day  . multivitamin with minerals  1  tablet Oral Daily  . pneumococcal 23 valent vaccine  0.5 mL Intramuscular Tomorrow-1000   Continuous Infusions: . sodium chloride 50 mL/hr at 07/30/14 0600   PRN Meds:.fentaNYL (SUBLIMAZE) injection, menthol-cetylpyridinium **OR** phenol, methocarbamol **OR** methocarbamol (ROBAXIN)  IV, metoCLOPramide **OR** metoCLOPramide (REGLAN) injection, ondansetron **OR** ondansetron (ZOFRAN) IV, oxyCODONE Assessment/Plan: Principal Problem:   Hip fracture, right Active Problems:   Essential hypertension   Atrial fibrillation   SICK SINUS SYNDROME   PACEMAKER, PERMANENT   Repeated falls   Elevated transaminase level   Preop cardiovascular exam  #R femoral neck fracture s/p R hip hemiarthroplasty: Ms Christian had displaced R femoral neck fracture noted on hip x-ray secondary to fall. She underwent successful R hip hemiarthroplasty 4/17  with no noted complications after cardiology said she is poor candidate for aggressive cardiac evaluation but proceed with surgery for comfort and quality of life. She says her pain is adequately controlled currently and social work is tryign to arrange SNF -appreciate ortho, cards, social work -d/c morphine and start oxy-ir 5 mg po q4hprn for pain -robaxin 500 mg po or iv q6hprn -reglan 5-10 mg po or iv q8hprn -weight bear as tolerated per ortho  #Fall: Ms Val EagleLemons has had two falls in about the past two days prior to presentation that were witnessed by home health aid. She hit head on first fall and has R periorbital ecchymosis but no fractures on CT. The fall the morning of presentation resulted in R femoral neck fracture. Although R knee pain on palpation for ortho, x-ray negative. The falls could be mechanical as they have occurred in the early morning and she has severe dementia. She was dehydrated on presentation and received NS IVF. PPM interrogated in ED and appears clear. TTE unrevealing other than mild to moderate PA pressure. Head CT without acute process but  TIA possible. Seizure also considered as she was noted to have dry stool on EMS arrival but health health aid did not note seizure like activity. Also consider abuse in setting of severe dementia. -appreciate neuro, social work -EEG -routine neurovascular checks -oxy-ir 5 mg po q4hprn -telemetry  #Paroxysmal atrial fibrillation: Ms Val EagleLemons is currently in normal sinus rhythm. She is followed by Dr Hill[facility]red [facility]. Last seen 03/12/14 with no new recommendations. She is on atenolol 100 mg daily, cartia xt 120 mg daily, xarelto 15 mg daily. She was seen by cardiology for surgery clearance and they recommend given her severe dementia, not continuing xarelto. Son was in agreement -cont atenolol 100 mg daily, cartia xt 120 mg daily  #Elevated transaminase: Ms Val EagleLemons had AST 206 with ALT 45 on presentation up to 226 and 169 on 4/17. Her family denies any alcohol use. Abdominal ultrasound negative. Hepatitis panel negative. After holding tylenol and statin, AST and ALT this morning 53 and 69, respectively -hold tylenol and statin -cont to monitor  #Sick sinus syndrome s/p PPM: Ms Val EagleLemons is s/p permanent pacemaker secondary to sick sinus syndrome. She is followed by Dr Hillis [facility]red [facility]. Last seen 03/12/14 with normal pacemaker function and no changes. Interrogated in ED and appears normal. She is on cartia xt 120 mg daily. -cont cartia xt 120 mg daily  #HTN: BP 110s-130s/50s-60s. At home she is on atenolol 10 mg daily, cartiz xt 120 mg daily, atorvastatin 10 mg daily. -cont atenolol 10 mg daily, cartiz xt 120 mg daily -holding atorvastatin 10 mg daily in setting of elevated transaminases  #HL: No lipid panel in our EMR. At home she is on atorvastatin 10 mg daily. -holding atorvastatin 10 mg daily given elevated transaminases on presentation  #Advanced Dementia: Ms Val EagleLemons is alert and oriented x 1 only to self. She could not answer simple review of system questions  or engage in conversation. Her son said that this is her baseline. TSH wnl. He says she lives alone but has 24 hour home health aid. At home she is on donepezil 10 mg daily.  -cont donepezil 10 mg daily -consult social work  #[facility]: [facility]  #DVT PPx: heparin 5000 u Orwell tid  #Code: Full  Dispo: Disposition is deferred at this time, awaiting improvement of current medical problems.  Anticipated discharge in approximately 1 day(s).   The patient  does have a current PCP (Ignatius Specking, MD) and does need an [facility] hospital follow-up appointment after discharge.  The patient does not have transportation limitations that hinder transportation to clinic appointments.  .Services Needed at time of discharge: Y = Yes, Blank = No PT:   OT:   RN:   Equipment:   Other:     LOS: 2 days   Lorenda Hatchet, MD 07/30/2014, 10:56 AM

## 2014-07-30 NOTE — Evaluation (Signed)
Physical Therapy Evaluation Patient Details Name: Anna Sutton MRN: [id number] DOB: 03/14/1941 Today's Date: 07/30/2014   History of Present Illness  Pt is a 74 y/o F s/p fall, R hip fx, and R hip hemiarthroplasty.  Pt's PMH includes paroxysmal a fib, HTN, sick sinus syndrome, pacemaker, severe dementia.   Clinical Impression  Patient is s/p above surgery resulting in functional limitations due to the deficits listed below (see PT Problem List). Pt's has severe dementia and follows 1 step commands inconsistently.  Pt's grandaughter reports pt has difficulty swallowing her pills (otherwise no issues), thus a recommendation for speech therapy services has been made.  Pt required max A x2 for stand pivot to chair w/ pt shuffling rather than picking up BLEs. Patient will benefit from skilled PT to increase their independence and safety with mobility to allow discharge to the venue listed below.      Follow Up Recommendations SNF;Supervision/Assistance - 24 hour    Equipment Recommendations  Rolling walker with 5" wheels;3in1 (PT)    Recommendations for Other Services Speech consult     Precautions / Restrictions Precautions Precautions: Anterior Hip;Fall Precaution Booklet Issued: Yes (comment) Precaution Comments: Reviewed ant hip precautions, no sign of learning Restrictions Weight Bearing Restrictions: Yes RLE Weight Bearing: Weight bearing as tolerated      Mobility  Bed Mobility Overal bed mobility: Needs Assistance Bed Mobility: Supine to Sit     Supine to sit: Max assist;HOB elevated     General bed mobility comments: Max A using bed pad for supine>sit.  Pt reluctant to move 2/2 pain in R hip and is extremely vocal.  Pt requires max A supporting trunk posteriorly during supine>sit but able to sit EOB w/ BUEs supported.  Transfers Overall transfer level: Needs assistance Equipment used: Rolling walker (2 wheeled) Transfers: Sit to/from [employer]Stand;Stand Pivot Transfers Sit to  Stand: Max assist;+2 physical assistance;+2 safety/equipment Stand pivot transfers: Max assist;+2 physical assistance;+2 safety/equipment       General transfer comment: Max A to rise from sitting and verbal cues and tactile cues for hand placement on RW.  Pt shuffled to pivot to chair and required assist to move RW.  Pt has decreased awareness of safety.  Ambulation/Gait             General Gait Details: pivotal steps only  Stairs            Wheelchair Mobility    Modified Rankin (Stroke Patients Only)       Balance Overall balance assessment: Needs assistance;History of Falls (2 falls in 5 day span) Sitting-balance support: Bilateral upper extremity supported;Feet supported Sitting balance-Leahy Scale: Poor   Postural control: Posterior lean Standing balance support: Bilateral upper extremity supported;During functional activity Standing balance-Leahy Scale: Poor                               Pertinent Vitals/Pain Pain Assessment: Faces Faces Pain Scale: Hurts whole lot Pain Location: R hip Pain Descriptors / Indicators: Aching;Grimacing;Guarding Pain Intervention(s): Limited activity within patient's tolerance;Monitored during session;Repositioned    Home Living Family/patient expects to be discharged to:: Skilled nursing facility Living Arrangements: Alone                    Prior Function Level of Independence: Needs assistance (Grandaughter at home during day, helper for assist otherwise)   Gait / Transfers Assistance Needed: No assist prior to admission  ADL's / Homemaking Assistance  Needed: Assist w/ bathing, dressing        Hand Dominance   Dominant Hand: Right    Extremity/Trunk Assessment   Upper Extremity Assessment: Defer to OT evaluation           Lower Extremity Assessment: RLE deficits/detail;Generalized weakness;Difficult to assess due to impaired cognition RLE Deficits / Details: as expected s/p R hip  hemiarthroplasty    Cervical / Trunk Assessment: Kyphotic  Communication   Communication: No difficulties  Cognition Arousal/Alertness: Awake/alert Behavior During Therapy: Agitated;Flat affect Overall Cognitive Status: History of cognitive impairments - at baseline (severe dementia)       Memory: Decreased short-term memory              General Comments General comments (skin integrity, edema, etc.): Pt w/ severe dementia limiting participation in therapy    Exercises Total Joint Exercises Ankle Circles/Pumps: AROM;PROM;Left;Right;10 reps;Supine (AROM LLE, PROM RLE) Heel Slides:  (Pt w/ severe guarding 2/2 R hip pain) Hip ABduction/ADduction: PROM;Right;5 reps;Supine      Assessment/Plan    PT Assessment Patient needs continued PT services  PT Diagnosis Difficulty walking;Abnormality of gait;Generalized weakness;Acute pain   PT Problem List Decreased strength;Decreased range of motion;Decreased activity tolerance;Decreased balance;Decreased mobility;Decreased coordination;Decreased cognition;Decreased knowledge of use of DME;Decreased safety awareness;Decreased knowledge of precautions;Pain  PT Treatment Interventions DME instruction;Gait training;Stair training;Functional mobility training;Therapeutic activities;Therapeutic exercise;Balance training;Neuromuscular re-education;Cognitive remediation;Patient/family education;Modalities   PT Goals (Current goals can be found in the Care Plan section) Acute Rehab PT Goals Patient Stated Goal: none stated PT Goal Formulation: With patient/family Time For Goal Achievement: 08/06/14 Potential to Achieve Goals: Good    Frequency 7X/week   Barriers to discharge        Co-evaluation               End of Session Equipment Utilized During Treatment: Gait belt Activity Tolerance: Patient limited by pain Patient left: in chair;with call bell/phone within reach;with chair alarm set;with family/visitor present Nurse  Communication: Mobility status;Precautions;Weight bearing status         Time: 1127-1158 PT Time Calculation (min) (ACUTE ONLY): 31 min   Charges:   PT Evaluation $Initial PT Evaluation Tier I: 1 Procedure PT Treatments $Therapeutic Exercise: 8-22 mins   PT G Codes:       Michail Jewels PT, DPT [phone] Pager: [phone]  07/30/2014, 12:20 PM

## 2014-07-31 DIAGNOSIS — Z950 Presence of cardiac pacemaker: Secondary | ICD-10-CM

## 2014-07-31 DIAGNOSIS — I495 Sick sinus syndrome: Secondary | ICD-10-CM

## 2014-07-31 LAB — URINALYSIS, ROUTINE W REFLEX MICROSCOPIC
BILIRUBIN URINE: NEGATIVE
GLUCOSE, UA: NEGATIVE mg/dL
Hgb urine dipstick: NEGATIVE
Ketones, ur: NEGATIVE mg/dL
LEUKOCYTES UA: NEGATIVE
NITRITE: NEGATIVE
Protein, ur: NEGATIVE mg/dL
Specific Gravity, Urine: 1.006 (ref 1.005–1.030)
UROBILINOGEN UA: 0.2 mg/dL (ref 0.0–1.0)
pH: 6.5 (ref 5.0–8.0)

## 2014-07-31 LAB — COMPREHENSIVE METABOLIC PANEL
ALT: 31 U/L (ref 0–35)
AST: 25 U/L (ref 0–37)
Albumin: 2.2 g/dL — ABNORMAL LOW (ref 3.5–5.2)
Alkaline Phosphatase: 86 U/L (ref 39–117)
Anion gap: 7 (ref 5–15)
BUN: 8 mg/dL (ref 6–23)
CALCIUM: 8 mg/dL — AB (ref 8.4–10.5)
CO2: 27 mmol/L (ref 19–32)
CREATININE: 0.68 mg/dL (ref 0.50–1.10)
Chloride: 106 mmol/L (ref 96–112)
GFR calc Af Amer: >90 mL/min (ref >90)
GFR, EST NON AFRICAN AMERICAN: 83 mL/min — AB (ref >90)
Glucose, Bld: 137 mg/dL — ABNORMAL HIGH (ref 70–99)
Potassium: 3.8 mmol/L (ref 3.5–5.1)
Sodium: 140 mmol/L (ref 135–145)
TOTAL PROTEIN: 5.2 g/dL — AB (ref 6.0–8.3)
Total Bilirubin: 0.7 mg/dL (ref 0.3–1.2)

## 2014-07-31 LAB — URINE CULTURE
Colony Count: NO GROWTH
Culture: NO GROWTH

## 2014-07-31 LAB — CBC
HCT: 29.7 % — ABNORMAL LOW (ref 36.0–46.0)
HEMOGLOBIN: 9.6 g/dL — AB (ref 12.0–15.0)
MCH: 29.1 pg (ref 26.0–34.0)
MCHC: 32.3 g/dL (ref 30.0–36.0)
MCV: 90 fL (ref 78.0–100.0)
PLATELETS: 173 10*3/uL (ref 150–400)
RBC: 3.3 MIL/uL — AB (ref 3.87–5.11)
RDW: 14.1 % (ref 11.5–15.5)
WBC: 8.1 10*3/uL (ref 4.0–10.5)

## 2014-07-31 LAB — GLUCOSE, CAPILLARY: GLUCOSE-CAPILLARY: 126 mg/dL — AB (ref 70–99)

## 2014-07-31 MED ORDER — ASPIRIN 325 MG PO TBEC: 325.0000 mg | DELAYED_RELEASE_TABLET | Freq: Every day | ORAL | Status: AC

## 2014-07-31 MED ORDER — OXYCODONE HCL 5 MG PO TABS: 5.0000 mg | ORAL_TABLET | ORAL | Status: AC | PRN

## 2014-07-31 NOTE — Progress Notes (Signed)
Physical Therapy Treatment Patient Details Name: Anna SellaCarol H Sutton MRN: [id number] DOB: 04/14/1940 Today's Date: 07/31/2014    History of Present Illness Pt is a 74 y/o F s/p fall, R hip fx, and R hip hemiarthroplasty.  Pt's PMH includes paroxysmal a fib, HTN, sick sinus syndrome, pacemaker, severe dementia.     PT Comments    +2 max assist for bed to recliner transfer. Pain and dementia are limiting progress with mobility. Pt tolerates movement of RLE poorly.   Follow Up Recommendations  SNF;Supervision/Assistance - 24 hour     Equipment Recommendations  Rolling walker with 5" wheels;3in1 (PT)    Recommendations for Other Services Speech consult     Precautions / Restrictions Precautions Precautions: Anterior Hip;Fall Precaution Booklet Issued: Yes (comment) Precaution Comments: Reviewed ant hip precautions, no sign of learning Restrictions Weight Bearing Restrictions: Yes RLE Weight Bearing: Weight bearing as tolerated    Mobility  Bed Mobility Overal bed mobility: Needs Assistance Bed Mobility: Supine to Sit     Supine to sit: Max assist;HOB elevated;+2 for physical assistance     General bed mobility comments: Max A using bed pad for supine>sit.  Pt reluctant to move 2/2 pain in R hip and is extremely vocal.  Pt requires max A supporting trunk posteriorly during supine>sit but able to sit EOB w/ BUEs supported.  Transfers Overall transfer level: Needs assistance Equipment used: None Transfers: Stand Pivot Transfers;Sit to/from Stand Sit to Stand: Max assist;+2 physical assistance;+2 safety/equipment Stand pivot transfers: Max assist;+2 physical assistance;+2 safety/equipment       General transfer comment: Max A to rise from sitting and verbal cues and tactile cues for hand placement.  Pt shuffled to pivot to chair and required assist for balance and to guide hips to reclienr.  Pt has decreased awareness of safety.  Ambulation/Gait             General Gait  Details: pivotal steps only   Stairs            Wheelchair Mobility    Modified Rankin (Stroke Patients Only)       Balance Overall balance assessment: History of Falls;Needs assistance Sitting-balance support: Bilateral upper extremity supported Sitting balance-Leahy Scale: Poor Sitting balance - Comments: can maintain static sitting with BUE supported on EOB   Standing balance support: Bilateral upper extremity supported Standing balance-Leahy Scale: Poor                      Cognition Arousal/Alertness: Awake/alert Behavior During Therapy: Agitated Overall Cognitive Status: History of cognitive impairments - at baseline (severe dementia)       Memory: Decreased short-term memory (pt not oriented to place nor to situation, she stated she's at her granny's and that she has to go to school on Monday)              Exercises Total Joint Exercises Ankle Circles/Pumps: Right;10 reps;Supine;AAROM (AROM LLE, PROM RLE) Heel Slides:  (Pt w/ severe guarding 2/2 R hip pain) Hip ABduction/ADduction: PROM;Right;5 reps;Supine (tolerance limited by pain)    General Comments        Pertinent Vitals/Pain Pain Assessment: Faces Faces Pain Scale: Hurts whole lot Pain Location: R hip Pain Descriptors / Indicators: Aching Pain Intervention(s): Patient requesting pain meds-RN notified;Monitored during session;Limited activity within patient's tolerance;Ice applied    Home Living                      Prior Function  PT Goals (current goals can now be found in the care plan section) Acute Rehab PT Goals Patient Stated Goal: none stated PT Goal Formulation: With patient/family Time For Goal Achievement: 08/06/14 Potential to Achieve Goals: Good Progress towards PT goals: Not progressing toward goals - comment (pain and dementia limiting progress)    Frequency  7X/week    PT Plan Current plan remains appropriate    Co-evaluation              End of Session Equipment Utilized During Treatment: Gait belt Activity Tolerance: Patient limited by pain Patient left: in chair;with call bell/phone within reach;with chair alarm set;with family/visitor present     Time: 1308-6578 PT Time Calculation (min) (ACUTE ONLY): 26 min  Charges:  $Therapeutic Exercise: 8-22 mins $Therapeutic Activity: 8-22 mins                    G Codes:      Tamala Ser 07/31/2014, 9:27 AM [phone]

## 2014-07-31 NOTE — Care Management Note (Signed)
CARE MANAGEMENT NOTE 07/31/2014  Patient:  Anna Sutton,Anna Sutton   Account Number:  [id number]  Date Initiated:  07/31/2014  Documentation initiated by:  Vance PeperBRADY,Shantinique Picazo  Subjective/Objective Assessment:   74 yr old female admitted with a right hip fracture, patient underwent a right hip hemiarthroplasty.  Patient lives at home with son and has 24hr. care.     Action/Plan:   [facility] Social Worker is aware.   Anticipated DC Date:  08/01/2014   Anticipated DC Plan:  SKILLED NURSING FACILITY  In-house referral  Clinical Social Worker      DC Planning Services  CM consult      [facility]  [facility]   Choice offered to / List presented to:     DME arranged  NA        HH arranged  NA      Status of service:  Completed, signed off Medicare Important Message given?  YES (If response is "NO", the following Medicare IM given date fields will be blank) Date Medicare IM given:  07/31/2014 Medicare IM given by:  Vance PeperBRADY,Shmuel Girgis Date Additional Medicare IM given:   Additional Medicare IM given by:    Discharge Disposition:  SKILLED NURSING FACILITY  Per UR Regulation:  Reviewed for med. necessity/level of care/duration of stay

## 2014-07-31 NOTE — Progress Notes (Signed)
Subjective: No overnight events. Currently resting comfortably. Son feels she is back at her baseline. EEG completed shows occasional right parieto-occipital sharp waves.   Objective: Current vital signs: BP 117/87 mmHg  Pulse 91  Temp(Src) 98.3 F (36.8 C) (Oral)  Resp 17  Ht 5\' 5"  (1.651 m)  Wt 55.792 kg (123 lb)  BMI 20.47 kg/m2  SpO2 97% Vital signs in last 24 hours: Temp:  [98.3 F (36.8 C)-100.9 F (38.3 C)] 98.3 F (36.8 C) (04/19 0500) Pulse Rate:  [66-91] 91 (04/19 0500) Resp:  [16-18] 17 (04/19 0500) BP: (108-117)/(45-87) 117/87 mmHg (04/19 0500) SpO2:  [97 %-100 %] 97 % (04/19 0500) Weight:  [55.792 kg (123 lb)] 55.792 kg (123 lb) (04/19 0500)  Intake/Output from previous day: 04/18 0701 - 04/19 0700 In: 480 [P.O.:480] Out: 1350 [Urine:1350] Intake/Output this shift: Total I/O In: -  Out: 1500 [Urine:1500] Nutritional status: Diet Heart Room service appropriate?: Yes; Fluid consistency:: Thin  Neurologic Exam: Alert, but very confused. (son states it is her baseline) Speech fluent without evidence of aphasia.  Cranial Nerves: II:  pupils equal, round, reactive to light and accommodation III,IV, VI: ptosis not present, extra-ocular motions intact bilaterally V,VII: smile symmetric, facial light touch sensation normal bilaterally Motor: Patient able to move all extremities against gravity except the right lower extremity which was not tested secondary to her displaced femur fracture. She was able to flex and dorsiflex her foot on the right. Tone and bulk:normal tone throughout; mild atrophy noted Lab Results: Basic Metabolic Panel:  Recent Labs Lab 07/28/14 0742 07/29/14 0520 07/30/14 0540 07/31/14 0534  NA 140 137 134* 140  K 3.8 3.8 3.6 3.8  CL 100 100 98 106  CO2 26 27 27 27   GLUCOSE 171* 133* 138* 137*  BUN 12 12 9 8   CREATININE 0.91 0.94 0.84 0.68  CALCIUM 8.9 8.6 8.0* 8.0*    Liver Function Tests:  Recent Labs Lab 07/28/14 0742  07/29/14 0520 07/30/14 0540 07/31/14 0534  AST 206* 226* 53* 25  ALT 45* 169* 69* 31  ALKPHOS 108 119* 86 86  BILITOT 0.9 1.4* 1.2 0.7  PROT 6.9 6.2 5.3* 5.2*  ALBUMIN 3.6 3.0* 2.4* 2.2*   No results for input(s): LIPASE, AMYLASE in the last 168 hours. No results for input(s): AMMONIA in the last 168 hours.  CBC:  Recent Labs Lab 07/28/14 0742 07/29/14 0520 07/30/14 0540 07/31/14 0534  WBC 12.6* 12.2* 10.3 8.1  NEUTROABS 11.1*  --   --   --   HGB 11.9* 11.1* 9.4* 9.6*  HCT 37.2 35.2* 29.7* 29.7*  MCV 90.7 91.7 92.2 90.0  PLT 226 174 159 173    Cardiac Enzymes:  Recent Labs Lab 07/28/14 0742  CKTOTAL 67    Lipid Panel: No results for input(s): CHOL, TRIG, HDL, CHOLHDL, VLDL, LDLCALC in the last 168 hours.  CBG:  Recent Labs Lab 07/29/14 0648 07/30/14 0553 07/31/14 0630  GLUCAP 112* 137* 126*    Microbiology: Results for orders placed or performed during the hospital encounter of 07/28/14  Urine culture     Status: None (Preliminary result)   Collection Time: 07/28/14  7:40 AM  Result Value Ref Range Status   Specimen Description URINE, CATHETERIZED  Final   Special Requests NONE  Final   Colony Count NO GROWTH [facility]formed at [facility]   Final   Culture NO GROWTH Performed at [facility]   Final   Report Status PENDING  Incomplete  Surgical pcr  screen     Status: Abnormal   Collection Time: 07/29/14  5:15 AM  Result Value Ref Range Status   MRSA, PCR NEGATIVE NEGATIVE Final   Staphylococcus aureus POSITIVE (A) NEGATIVE Final    Comment:        The Xpert SA Assay (FDA approved for NASAL specimens in patients over 74 years of age), is one component of a comprehensive surveillance program.  Test performance has been validated by [facility] for patients greater than or equal to 28 year old. It is not intended to diagnose infection nor to guide or monitor treatment.     Coagulation Studies:  Recent Labs   07/29/14 0520  LABPROT 16.0*  INR 1.27    Imaging: Pelvis Portable  07/29/2014   CLINICAL DATA:  Status post right hip replacement  EXAM: PORTABLE PELVIS 1-2 VIEWS  COMPARISON:  None.  FINDINGS: A right hip replacement is seen. Pelvic ring is intact. No acute bony abnormality is noted. No soft tissue changes are seen.  IMPRESSION: Status post right hip replacement   Electronically Signed   By: Alcide Clever M.D.   On: 07/29/2014 17:09   US Abdomen Limited Ruq  07/29/2014   CLINICAL DATA:  Elevated transaminases.  Post cholecystectomy.  EXAM: US ABDOMEN LIMITED - RIGHT UPPER QUADRANT  COMPARISON:  None.  FINDINGS: Gallbladder:  Surgically absent  Common bile duct:  Diameter: 11 mm  Liver:  Mildly heterogeneous in echogenicity without focal lesion identified.  IMPRESSION: Common bile duct measures 11 mm in diameter. This is slightly greater than that expected for post cholecystectomy state. In the setting of abnormal laboratory values, consider correlation with MRCP.   Electronically Signed   By: Annia Belt M.D.   On: 07/29/2014 12:19    Medications:  Scheduled: . antiseptic oral rinse  7 mL Mouth Rinse BID  . aspirin EC  325 mg Oral Q breakfast  . atenolol  12.5 mg Oral Daily  . diltiazem  120 mg Oral Daily  . donepezil  10 mg Oral Daily  . heparin subcutaneous  5,000 Units Subcutaneous 3 times per day  . multivitamin with minerals  1 tablet Oral Daily  . pneumococcal 23 valent vaccine  0.5 mL Intramuscular Tomorrow-1000    Assessment/Plan: Pleasant 74 year old female with 2 recent falls one of which was witnessed. No frank seizure activity observed. No loss of consciousness. No postictal symptoms described. No previous seizure history. No convincing evidence of seizure activity at this time with normal neurologic exam other than advanced dementia.  EEG shows right parieto-occipital sharp waves.   Overall low suspicion that her falls are related to seizures as the history and  presentation is not consistent with this. Based on EEG findings would suggest a MRI brain with and without contrast to rule out structural process. If unremarkable no further neurological workup indicated and would not start on antiepileptic medication at this time.      LOS: 3 days   Elspeth Cho, DO [facility] [phone]  If 7pm- 7am, please page neurology on call as listed in AMION. 07/31/2014  10:01 AM

## 2014-07-31 NOTE — Progress Notes (Signed)
Subjective: Anna Sutton has no complaints this morning while sitting up in chair. She specifically denied any hip pain. Her relative says that she seems in pain when PT helped move her to the chair but otherwise has seemed comfortable.   Objective: Vital signs in last 24 hours: Filed Vitals:   07/30/14 2000 07/31/14 0000 07/31/14 0350 07/31/14 0500  BP:    117/87  Pulse:    91  Temp:    98.3 F (36.8 C)  TempSrc:    Oral  Resp: Height:      Weight:    123 lb (55.792 kg)  SpO2: 99% 98% 99% 97%   Weight change:   Intake/Output Summary (Last 24 hours) at 07/31/14 1027 Last data filed at 07/31/14 0900  Gross per 24 hour  Intake    240 ml  Output   2850 ml  Net  -2610 ml   Gen: No acute distress, well developed, well nourished HEENT: R periorbital ecchymosis, chipped teeth, PERRL, EOMI, sclerae anicteric, moist mucous membranes Heart: Regular rate and rhythm, normal S1 S2, no murmurs, rubs, or gallops Lungs: Clear to auscultation bilaterally, respirations unlabored Abd: Soft, non-tender, non-distended, + bowel sounds, no hepatosplenomegaly Ext: Well bandaged incision on R hip, 2+ DP and PT pulses, no edema or cyanosis  Neuro: A&O x 1 (self), CN II-XII intact, strength 5/5 on finger squeeze, foot dorsiflexion and plantarflexion, sensation grossly intact, no Babinkski sign b/l   Lab Results: Basic Metabolic Panel:  Recent Labs Lab 07/30/14 0540 07/31/14 0534  NA 134* 140  K 3.6 3.8  CL 98 106  CO2 27 27  GLUCOSE 138* 137*  BUN 9 8  CREATININE 0.84 0.68  CALCIUM 8.0* 8.0*   Liver Function Tests:  Recent Labs Lab 07/30/14 0540 07/31/14 0534  AST 53* 25  ALT 69* 31  ALKPHOS 86 86  BILITOT 1.2 0.7  PROT 5.3* 5.2*  ALBUMIN 2.4* 2.2*   CBC:  Recent Labs Lab 07/28/14 0742  07/30/14 0540 07/31/14 0534  WBC 12.6*  < > 10.3 8.1  NEUTROABS 11.1*  --   --   --   HGB 11.9*  < > 9.4* 9.6*  HCT 37.2  < > 29.7* 29.7*  MCV 90.7  < > 92.2 90.0  PLT  226  < > 159 173  < > = values in this interval not displayed. Cardiac Enzymes:  Recent Labs Lab 07/28/14 0742  CKTOTAL 67   CBG:  Recent Labs Lab 07/29/14 0648 07/30/14 0553 07/31/14 0630  GLUCAP 112* 137* 126*   Thyroid Function Tests:  Recent Labs Lab 07/28/14 1513  TSH 3.219   Coagulation:  Recent Labs Lab 07/28/14 0742 07/29/14 0520  LABPROT 16.9* 16.0*  INR 1.36 1.27   Urine Drug Screen: Drugs of Abuse     Component Value Date/Time   LABOPIA POSITIVE* 07/28/2014 0740   COCAINSCRNUR NONE DETECTED 07/28/2014 0740   LABBENZ NONE DETECTED 07/28/2014 0740   AMPHETMU NONE DETECTED 07/28/2014 0740   THCU NONE DETECTED 07/28/2014 0740   LABBARB NONE DETECTED 07/28/2014 0740  Urinalysis:  Recent Labs Lab 07/28/14 0740 07/31/14 0603  COLORURINE YELLOW YELLOW  LABSPEC 1.005 1.006  PHURINE 7.0 6.5  GLUCOSEU NEGATIVE NEGATIVE  HGBUR NEGATIVE NEGATIVE  BILIRUBINUR NEGATIVE NEGATIVE  KETONESUR NEGATIVE NEGATIVE  PROTEINUR NEGATIVE NEGATIVE  UROBILINOGEN 0.2 0.2  NITRITE NEGATIVE NEGATIVE  LEUKOCYTESUR SMALL* NEGATIVE   Misc. Labs: Lactic acid 2.25 --> 1.2 istat troponin 0.01  Acute hepatitis  panel negative  Micro Results: Recent Results (from the past 240 hour(s))  Urine culture     Status: None (Preliminary result)   Collection Time: 07/28/14  7:40 AM  Result Value Ref Range Status   Specimen Description URINE, CATHETERIZED  Final   Special Requests NONE  Final   Colony Count NO GROWTH Performed at [facility]   Final   Culture NO GROWTH Performed at [facility]   Final   Report Status PENDING  Incomplete  Surgical pcr screen     Status: Abnormal   Collection Time: 07/29/14  5:15 AM  Result Value Ref Range Status   MRSA, PCR NEGATIVE NEGATIVE Final   Staphylococcus aureus POSITIVE (A) NEGATIVE Final    Comment:        The Xpert SA Assay (FDA approved for NASAL specimens in patients over 57 years of age), is one  component of a comprehensive surveillance program.  Test performance has been validated by [facility] for patients greater than or equal to 28 year old. It is not intended to diagnose infection nor to guide or monitor treatment.    Studies/Results: Pelvis Portable  07/29/2014   CLINICAL DATA:  Status post right hip replacement  EXAM: PORTABLE PELVIS 1-2 VIEWS  COMPARISON:  None.  FINDINGS: A right hip replacement is seen. Pelvic ring is intact. No acute bony abnormality is noted. No soft tissue changes are seen.  IMPRESSION: Status post right hip replacement   Electronically Signed   By: Alcide Clever M.D.   On: 07/29/2014 17:09   US Abdomen Limited Ruq  07/29/2014   CLINICAL DATA:  Elevated transaminases.  Post cholecystectomy.  EXAM: US ABDOMEN LIMITED - RIGHT UPPER QUADRANT  COMPARISON:  None.  FINDINGS: Gallbladder:  Surgically absent  Common bile duct:  Diameter: 11 mm  Liver:  Mildly heterogeneous in echogenicity without focal lesion identified.  IMPRESSION: Common bile duct measures 11 mm in diameter. This is slightly greater than that expected for post cholecystectomy state. In the setting of abnormal laboratory values, consider correlation with MRCP.   Electronically Signed   By: Annia Belt M.D.   On: 07/29/2014 12:19   TTE: Study Conclusions  - Left ventricle: Systolic function was normal. The estimated ejection fraction was in the range of 60% to 65%. Wall motion was normal; there were no regional wall motion abnormalities. Left ventricular diastolic function parameters were normal. - Mitral valve: Calcified annulus. - Pulmonary arteries: Systolic pressure was mildly to moderately increased. PA peak pressure: 47 mm Hg (S).  Medications: I have reviewed the patient's current medications. Scheduled Meds: . antiseptic oral rinse  7 mL Mouth Rinse BID  . aspirin EC  325 mg Oral Q breakfast  . atenolol  12.5 mg Oral Daily  . diltiazem  120 mg Oral Daily  . donepezil   10 mg Oral Daily  . heparin subcutaneous  5,000 Units Subcutaneous 3 times per day  . multivitamin with minerals  1 tablet Oral Daily  . pneumococcal 23 valent vaccine  0.5 mL Intramuscular Tomorrow-1000   Continuous Infusions: . sodium chloride 50 mL/hr at 07/31/14 0345   PRN Meds:.fentaNYL (SUBLIMAZE) injection, menthol-cetylpyridinium **OR** phenol, methocarbamol **OR** methocarbamol (ROBAXIN)  IV, metoCLOPramide **OR** metoCLOPramide (REGLAN) injection, ondansetron **OR** ondansetron (ZOFRAN) IV, oxyCODONE Assessment/Plan: Principal Problem:   Hip fracture, right Active Problems:   Essential hypertension   Atrial fibrillation   SICK SINUS SYNDROME   PACEMAKER, PERMANENT   Repeated falls   Elevated transaminase level  Preop cardiovascular exam  #R femoral neck fracture s/p R hip hemiarthroplasty: Anna Sutton had displaced R femoral neck fracture noted on hip x-ray secondary to fall. She underwent successful R hip hemiarthroplasty 4/17 with no noted complications after cardiology said she is poor candidate for aggressive cardiac evaluation but proceed with surgery for comfort and quality of life. She says her pain is adequately controlled currently and social work has found available SNF. She has only used oxy-ir once this morning and once yesterday. -appreciate ortho, cards, social work -cont oxy-ir 5 mg po q4hprn for pain -robaxin 500 mg po or iv q6hprn -reglan 5-10 mg po or iv q8hprn -weight bear as tolerated per ortho  #Fall: Anna Sutton has had two falls in about the past two days prior to presentation that were witnessed by home health aid. She hit head on first fall and has R periorbital ecchymosis but no fractures on CT. The fall the morning of presentation resulted in R femoral neck fracture. Although R knee pain on palpation for ortho, x-ray negative. The falls could be mechanical as they have occurred in the early morning and she has severe dementia. She was dehydrated on  presentation and received NS IVF. PPM interrogated in ED and appears clear. TTE unrevealing other than mild to moderate PA pressure. Head CT without acute process but TIA possible. Seizure also considered as she was noted to have dry stool on EMS arrival and EEG on 4/18 noted  R parieto-occipital sharp waves but neurology still has low suspicion and does not feel further work-up indicated as she has pacemaker and cannot get MRI.  Also consider abuse in setting of severe dementia. -appreciate neuro, social work -routine neurovascular checks -oxy-ir 5 mg po q4hprn -telemetry  #Paroxysmal atrial fibrillation: Anna Sutton is currently in normal sinus rhythm. She is followed by Dr Hillis Range of [facility] cardiology. Last seen 03/12/14 with no new recommendations. She is on atenolol 100 mg daily, cartia xt 120 mg daily, xarelto 15 mg daily. She was seen by cardiology for surgery clearance and they recommend given her severe dementia, not continuing xarelto. Son was in agreement -cont atenolol 100 mg daily, cartia xt 120 mg daily  #Advanced Dementia: Anna Sutton is alert and oriented x 1 only to self. She could not answer simple review of system questions or engage in conversation. Her son said that this is her baseline. TSH wnl. He says she lives alone but has 24 hour home health aid. At home she is on donepezil 10 mg daily.  -PT/OT/SLP -cont donepezil 10 mg daily -consult social work  #Elevated transaminase: Anna Sutton had AST 206 with ALT 45 on presentation up to 226 and 169 on 4/17. Her family denies any alcohol use. Abdominal ultrasound negative. Hepatitis panel negative. After holding tylenol and statin, AST and ALT this morning 25 and 31, respectively -hold tylenol and statin -cont to monitor  #Sick sinus syndrome s/p PPM: Anna Sutton is s/p permanent pacemaker secondary to sick sinus syndrome. She is followed by Dr Hillis Range of [facility] cardiology. Last seen 03/12/14 with normal pacemaker function and  no changes. Interrogated in ED and appears normal. She is on cartia xt 120 mg daily. -cont cartia xt 120 mg daily  #HTN: BP 100s-120s/50s-80s. At home she is on atenolol 10 mg daily, cartiz xt 120 mg daily, atorvastatin 10 mg daily. -cont atenolol 10 mg daily, cartiz xt 120 mg daily -holding atorvastatin 10 mg daily due to elevated transaminases  #HL: No lipid  panel in our EMR. At home she is on atorvastatin 10 mg daily. -holding atorvastatin 10 mg daily given elevated transaminases on presentation  #Diet: HH  #DVT PPx: heparin 5000 u Santa Claus tid  #Code: Full  Dispo: Disposition is deferred at this time, awaiting improvement of current medical problems.  Anticipated discharge in approximately 1 day(s).   The patient does have a current PCP (Ignatius Speckinghruv B. Vyas, MD) and [facility] after discharge.  The patient does not have transportation limitations that hinder transportation to clinic appointments.  .Services Needed at time of discharge: Y = Yes, Blank = No PT:   OT:   RN:   Equipment:   Other:     LOS: 3 days   Lorenda HatchetAdam L Iyana Topor, MD 07/31/2014, 10:27 AM

## 2014-07-31 NOTE — Discharge Planning (Signed)
[facility]. Patient's granddaughter updated at bedside.  Facility[facility] Report: [phone] Transportation: EMS ([address])  Marcelline DeistEmily Stevie Ertle, [place] Cell: [phone]       Fax: [phone] Clinical Social Work: Orthopedics [phone]([id number]) and Surgical [phone]([id number])

## 2014-07-31 NOTE — Clinical Social Work Placement (Signed)
Clinical Social Work Department CLINICAL SOCIAL WORK PLACEMENT NOTE 07/31/2014  Patient:  Anna Sutton,[name]  Account Number:  [id number] Admit date:  07/28/2014  Clinical Social Worker:  Leron CroakASSANDRA ALLEN, CLINICAL SOCIAL WORKER  Date/time:  07/29/2014 05:01 PM  Clinical Social Work is seeking post-discharge placement for this patient at the following level of care:   SKILLED NURSING   (*CSW will update this form in Epic as items are completed)   07/29/2014  Patient/family provided with [facility] Department of Clinical Social Work's list of facilities offering this level of care within the geographic area requested by the patient (or if unable, by the patient's family).  07/29/2014  Patient/family informed of their freedom to choose among providers that offer the needed level of care, that participate in Medicare, Medicaid or managed care program needed by the patient, have an available bed and are willing to accept the patient.  07/29/2014  Patient/family informed of [facility]' ownership interest in [facility], as well as of the fact that they are under no obligation to receive care at this facility.  PASARR submitted to EDS on 07/31/2014 PASARR number received on 07/31/2014  FL2 transmitted to all facilities in geographic area requested by pt/family on  07/31/2014 FL2 transmitted to all facilities within larger geographic area on   Patient informed that his/her managed care company has contracts with or will negotiate with  certain facilities, including the following:     Patient/family informed of bed offers received:  07/31/2014 [facility] Physician recommends and patient chooses bed at    [facility] on  07/31/2014 Patient to be transferred to facility by [facility] Patient and family notified of transfer on 07/31/2014 Name of family member notified:  Patient's granddaughter at bedside.  The  following physician request were entered in Epic:   Additional Comments:  Lily Kochermily Marvion Bastidas, LCSWA Cell: [phone]       Fax: [phone] Clinical Social Work: Orthopedics [phone]([id number]) and Surgical [phone]([id number])

## 2014-07-31 NOTE — Evaluation (Signed)
Clinical/Bedside Swallow Evaluation Patient Details  Name: Anna Sutton MRN: [id number] Date of Birth: 07/06/1940  Today's Date: 07/31/2014 Time: SLP Start Time (ACUTE ONLY): 1132 SLP Stop Time (ACUTE ONLY): 1144 SLP Time Calculation (min) (ACUTE ONLY): 12 min  Past Medical History:  Past Medical History  Diagnosis Date  . Paroxysmal atrial fibrillation   . Hyperlipidemia   . Hypertension   . Sick sinus syndrome     s/p PPM (MDT)  . Pacemaker    Past Surgical History:  Past Surgical History  Procedure Laterality Date  . Pacemaker placement  12/13/08    MDT Adapta L implanted by Dr Johney FrameAllred  . Hip arthroplasty Right 07/29/2014    Procedure: HIP HEMIARTHROPLASTY;  Surgeon: Kathryne Hitchhristopher Y Blackman, MD;  [facility];  Service: Orthopedics;  Laterality: Right;   HPI:  Pt is a 74 y/o F s/p fall, R hip fx, and R hip hemiarthroplasty.  Pt's PMH includes paroxysmal a fib, HTN, sick sinus syndrome, pacemaker, severe dementia.    Assessment / Plan / Recommendation Clinical Impression  Pt has a mild, cognitively-based oral dysphagia, also impacted by condition of dentition. Pt requires cueing for initiation of self-feeding, followed by Mod cues for smaller bites/sips. Oral phase is marked by slow mastication and oral holding with regular solids. No overt signs of aspiration are observed. Recommend Dys 2 diet and thin liquids.     Aspiration Risk  Moderate    Diet Recommendation Dysphagia 2 (Fine chop);Thin liquid   Liquid Administration via: Cup;Straw Medication Administration: Whole meds with puree Supervision: Patient able to self feed;Full supervision/cueing for compensatory strategies Compensations: Slow rate;Small sips/bites Postural Changes and/or Swallow Maneuvers: Seated upright 90 degrees;Upright 30-60 min after meal    Other  Recommendations Oral Care Recommendations: Oral care BID   Follow Up Recommendations  Skilled Nursing facility    Frequency and Duration min 2x/week   1 week   Pertinent Vitals/Pain n/a    SLP Swallow Goals     Swallow Study Prior Functional Status       General HPI: Pt is a 74 y/o F s/p fall, R hip fx, and R hip hemiarthroplasty.  Pt's PMH includes paroxysmal a fib, HTN, sick sinus syndrome, pacemaker, severe dementia.  Type of Study: Bedside swallow evaluation Previous Swallow Assessment: none in chart Diet Prior to this Study: Regular;Thin liquids Temperature Spikes Noted: Yes (100.9) Respiratory Status: Nasal cannula Behavior/Cognition: Alert;Cooperative;Confused;Requires cueing Oral Cavity - Dentition: Poor condition;Missing dentition Self-Feeding Abilities: Able to feed self;Needs assist Patient Positioning: Upright in chair Baseline Vocal Quality: Clear Volitional Cough: Strong Volitional Swallow: Able to elicit    Oral/Motor/Sensory Function Overall Oral Motor/Sensory Function: Appears within functional limits for tasks assessed   Ice Chips Ice chips: Not tested   Thin Liquid Thin Liquid: Within functional limits Presentation: Cup;Self Fed;Straw    Nectar Thick Nectar Thick Liquid: Not tested   Honey Thick Honey Thick Liquid: Not tested   Puree Puree: Within functional limits Presentation: Self Fed;Spoon   Solid    Solid: Impaired Presentation: Self Fed Oral Phase Impairments: Poor awareness of bolus;Impaired mastication Oral Phase Functional Implications: Oral holding      Anna Sutton, M.A. CCC-SLP [phone]  Anna Sutton, Debarah Mccumbers 07/31/2014,11:59 AM

## 2014-07-31 NOTE — Progress Notes (Signed)
   ELECTROPHYSIOLOGY DEVICE NOTE    Pt admitted with syncopal spell.  Asked to review pacemaker interrogation.  Pacemaker interrogation with 13.9% atrial fibrillation (chronic for patient). No ventricular high rate episodes.    Otherwise normal device function. Greater than 10 years battery longevity, lead impedence, sensing, thresholds within normal limits.   No pacemaker programming changes recommended.  No obvious cause for syncope based on device interrogation.   Anna BalsamAmber Seiler, NP 07/31/2014 1:54 PM    Hillis RangeJames Shaianne Nucci MD, [facility] 07/31/2014 3:16 PM

## 2014-07-31 NOTE — Discharge Summary (Signed)
Name: Anna Sutton MRN: [id number] DOB: 08/10/1940 74 y.o. PCP: Ignatius Specking, MD  Date of Admission: 07/28/2014  7:14 AM Date of Discharge: 07/31/2014 Attending Physician: Aletta Edouard, MD  Discharge Diagnosis:  Principal Problem:   Hip fracture, right Active Problems:   Essential hypertension   Atrial fibrillation   SICK SINUS SYNDROME   PACEMAKER, PERMANENT   Repeated falls   Elevated transaminase level   Preop cardiovascular exam  Discharge Medications:   Medication List    STOP taking these medications        atorvastatin 10 MG tablet  Commonly known as:  LIPITOR     Rivaroxaban 15 MG Tabs tablet  Commonly known as:  XARELTO      TAKE these medications        aspirin 325 MG EC tablet  Take 1 tablet (325 mg total) by mouth daily with breakfast.     atenolol 100 MG tablet  Commonly known as:  TENORMIN  Take 1 tablet (100 mg total) by mouth daily.     CARTIA XT 120 MG 24 hr capsule  Generic drug:  diltiazem  TAKE 1 CAPSULE BY MOUTH ONCE A DAY.     donepezil 10 MG tablet  Commonly known as:  ARICEPT  Take 10 mg by mouth daily.     multivitamin tablet  Take 1 tablet by mouth daily.     oxyCODONE 5 MG immediate release tablet  Commonly known as:  Oxy IR/ROXICODONE  Take 1 tablet (5 mg total) by mouth every 4 (four) hours as needed for moderate pain.        Disposition and follow-up:   Ms.Angelyse H Faught was discharged from [facility] in Stable condition.  At the hospital follow up visit please address:  1.  Post-operative course and pain, anti-coagulation and statin use  2.  Labs / imaging needed at time of follow-up: none  3.  Pending labs/ test needing follow-up: none  Follow-up Appointments: Follow-up Information    Follow up with Kathryne Hitch, MD. Schedule an appointment as soon as possible for a visit in 2 weeks.   Specialty:  Orthopedic Surgery   Contact information:   [address] [place]  [address] [phone]       Follow up with VYAS,DHRUV B., MD On 08/07/2014.   Specialty:  Internal Medicine   Why:  @ 1:15 pm   Contact information:   [address] [place] [address] 336 [phone]       Discharge Instructions: Discharge Instructions    Diet - low sodium heart healthy    Complete by:  As directed      Full weight bearing    Complete by:  As directed   Laterality:  right  Extremity:  Lower     Increase activity slowly    Complete by:  As directed            Consultations: Treatment Team:  Kathryne Hitch, MD [facility], MD  Procedures Performed:  Dg Chest 1 View  07/28/2014   CLINICAL DATA:  Pt fell this morning, seems to be very confused. She has right hip pain, and her foot is rotated inward. No known chest complaints Hx of paroxysmal atrail fibrillation, sick sinus syndrome, HTN  EXAM: CHEST  1 VIEW  COMPARISON:  12/14/2008  FINDINGS: Cardiac silhouette mildly enlarged. Normal mediastinal and hilar contours.  Clear lungs.  No pleural effusion or pneumothorax.  Left anterior chest wall  sequential pacemaker is stable and well positioned.  Bony thorax is intact.  IMPRESSION: No active disease.   Electronically Signed   By: Amie Portland M.D.   On: 07/28/2014 08:45   Ct Head Wo Contrast  07/28/2014   CLINICAL DATA:  RECENT FALL, PT. WITH RIGHT ORBITAL HEMATOMA-GREENISH/PURPLEISH IN COLOR, PT. UNABLE TO MOVE WITHOUT ASSISTANCE,  EXAM: CT HEAD WITHOUT CONTRAST  CT MAXILLOFACIAL WITHOUT CONTRAST  CT CERVICAL SPINE WITHOUT CONTRAST  TECHNIQUE: Multidetector CT imaging of the head, cervical spine, and maxillofacial structures were performed using the standard protocol without intravenous contrast. Multiplanar CT image reconstructions of the cervical spine and maxillofacial structures were also generated.  COMPARISON:  03/06/2014  FINDINGS: CT HEAD FINDINGS  Ventricles normal configuration. There is ventricular and sulcal enlargement reflecting mild to  moderate atrophy. No hydrocephalus.  There are no parenchymal masses or mass effect. There is no evidence of a cortical infarct. Mild periventricular white matter hypoattenuation is noted, stable, consistent with chronic small-vessel ischemic change.  There are no extra-axial masses or abnormal fluid collections.  There is no intracranial hemorrhage.  No skull fracture.  CT MAXILLOFACIAL FINDINGS  No fracture. Small foci of mucosal thickening in each maxillary sinus. Sinuses otherwise clear. Clear mastoid air cells and middle ear cavities.  Globes orbits are unremarkable. No soft tissue masses. Mild right lateral periorbital soft tissue edema. Degenerative changes are noted of both temporomandibular joints.  CT CERVICAL SPINE FINDINGS  No fracture. No spondylolisthesis. There are mild disc degenerative changes from C3-C4 through C5-C6 with endplate spurring and mild disc bulging by without significant loss disc height. There is no significant central stenosis or neural foraminal narrowing.  Soft tissues show carotid vascular calcifications but are otherwise unremarkable. Lung apices are clear.  IMPRESSION: HEAD CT:  No acute intracranial abnormalities.  No skull fracture.  MAXILLOFACIAL CT:  No fracture.  CERVICAL CT:  No fracture or acute finding.   Electronically Signed   By: Amie Portland M.D.   On: 07/28/2014 08:35   Ct Cervical Spine Wo Contrast  07/28/2014   CLINICAL DATA:  RECENT FALL, PT. WITH RIGHT ORBITAL HEMATOMA-GREENISH/PURPLEISH IN COLOR, PT. UNABLE TO MOVE WITHOUT ASSISTANCE,  EXAM: CT HEAD WITHOUT CONTRAST  CT MAXILLOFACIAL WITHOUT CONTRAST  CT CERVICAL SPINE WITHOUT CONTRAST  TECHNIQUE: Multidetector CT imaging of the head, cervical spine, and maxillofacial structures were performed using the standard protocol without intravenous contrast. Multiplanar CT image reconstructions of the cervical spine and maxillofacial structures were also generated.  COMPARISON:  03/06/2014  FINDINGS: CT HEAD  FINDINGS  Ventricles normal configuration. There is ventricular and sulcal enlargement reflecting mild to moderate atrophy. No hydrocephalus.  There are no parenchymal masses or mass effect. There is no evidence of a cortical infarct. Mild periventricular white matter hypoattenuation is noted, stable, consistent with chronic small-vessel ischemic change.  There are no extra-axial masses or abnormal fluid collections.  There is no intracranial hemorrhage.  No skull fracture.  CT MAXILLOFACIAL FINDINGS  No fracture. Small foci of mucosal thickening in each maxillary sinus. Sinuses otherwise clear. Clear mastoid air cells and middle ear cavities.  Globes orbits are unremarkable. No soft tissue masses. Mild right lateral periorbital soft tissue edema. Degenerative changes are noted of both temporomandibular joints.  CT CERVICAL SPINE FINDINGS  No fracture. No spondylolisthesis. There are mild disc degenerative changes from C3-C4 through C5-C6 with endplate spurring and mild disc bulging by without significant loss disc height. There is no significant central stenosis or neural foraminal narrowing.  Soft tissues show carotid vascular calcifications but are otherwise unremarkable. Lung apices are clear.  IMPRESSION: HEAD CT:  No acute intracranial abnormalities.  No skull fracture.  MAXILLOFACIAL CT:  No fracture.  CERVICAL CT:  No fracture or acute finding.   Electronically Signed   By: Amie Portlandavid  Ormond M.D.   On: 07/28/2014 08:35   Pelvis Portable  07/29/2014   CLINICAL DATA:  Status post right hip replacement  EXAM: PORTABLE PELVIS 1-2 VIEWS  COMPARISON:  None.  FINDINGS: A right hip replacement is seen. Pelvic ring is intact. No acute bony abnormality is noted. No soft tissue changes are seen.  IMPRESSION: Status post right hip replacement   Electronically Signed   By: Alcide CleverMark  Lukens M.D.   On: 07/29/2014 17:09   Dg Knee Right Port  07/28/2014   CLINICAL DATA:  Pt fell this morning, she has a known right femoral neck  Fx. R/o right knee fx. She has pain all the way down her leg, starting at her hip.  EXAM: PORTABLE RIGHT KNEE - 1-2 VIEW  COMPARISON:  None.  FINDINGS: No acute fracture. Mild medial joint space compartment narrowing. Small marginal osteophytes noted from the medial lateral compartments. Bones are demineralized.  No joint effusion.  Soft tissues are unremarkable.  IMPRESSION: No fracture or dislocation or acute finding.  Mild osteoarthritis.   Electronically Signed   By: Amie Portlandavid  Ormond M.D.   On: 07/28/2014 10:40   Dg Hip Unilat With Pelvis 2-3 Views Right  07/28/2014   CLINICAL DATA:  Pt fell this morning, seems to be very confused. She has right hip pain, and her foot is rotated inward. No known chest complaints Hx of paroxysmal atrail fibrillation, sick sinus syndrome, HTN  EXAM: RIGHT HIP (WITH PELVIS) 2-3 VIEWS  COMPARISON:  None.  FINDINGS: There is a fracture of the right femoral neck. Fracture is mid cervical. There is a single comminuted fracture fragment along the superior margin of fracture. The shaft fracture component has migrated superiorly, displacing 2.5 cm in relation to femoral head neck component. There is mild varus angulation.  No other fractures. Hip joints are normally aligned. Bones are diffusely demineralized.  IMPRESSION: Displaced right femoral neck fracture as described.   Electronically Signed   By: Amie Portlandavid  Ormond M.D.   On: 07/28/2014 08:46   Ct Maxillofacial Wo Cm  07/28/2014   CLINICAL DATA:  RECENT FALL, PT. WITH RIGHT ORBITAL HEMATOMA-GREENISH/PURPLEISH IN COLOR, PT. UNABLE TO MOVE WITHOUT ASSISTANCE,  EXAM: CT HEAD WITHOUT CONTRAST  CT MAXILLOFACIAL WITHOUT CONTRAST  CT CERVICAL SPINE WITHOUT CONTRAST  TECHNIQUE: Multidetector CT imaging of the head, cervical spine, and maxillofacial structures were performed using the standard protocol without intravenous contrast. Multiplanar CT image reconstructions of the cervical spine and maxillofacial structures were also generated.   COMPARISON:  03/06/2014  FINDINGS: CT HEAD FINDINGS  Ventricles normal configuration. There is ventricular and sulcal enlargement reflecting mild to moderate atrophy. No hydrocephalus.  There are no parenchymal masses or mass effect. There is no evidence of a cortical infarct. Mild periventricular white matter hypoattenuation is noted, stable, consistent with chronic small-vessel ischemic change.  There are no extra-axial masses or abnormal fluid collections.  There is no intracranial hemorrhage.  No skull fracture.  CT MAXILLOFACIAL FINDINGS  No fracture. Small foci of mucosal thickening in each maxillary sinus. Sinuses otherwise clear. Clear mastoid air cells and middle ear cavities.  Globes orbits are unremarkable. No soft tissue masses. Mild right lateral periorbital soft tissue edema. Degenerative changes  are noted of both temporomandibular joints.  CT CERVICAL SPINE FINDINGS  No fracture. No spondylolisthesis. There are mild disc degenerative changes from C3-C4 through C5-C6 with endplate spurring and mild disc bulging by without significant loss disc height. There is no significant central stenosis or neural foraminal narrowing.  Soft tissues show carotid vascular calcifications but are otherwise unremarkable. Lung apices are clear.  IMPRESSION: HEAD CT:  No acute intracranial abnormalities.  No skull fracture.  MAXILLOFACIAL CT:  No fracture.  CERVICAL CT:  No fracture or acute finding.   Electronically Signed   By: Amie Portland M.D.   On: 07/28/2014 08:35   US Abdomen Limited Ruq  07/29/2014   CLINICAL DATA:  Elevated transaminases.  Post cholecystectomy.  EXAM: US ABDOMEN LIMITED - RIGHT UPPER QUADRANT  COMPARISON:  None.  FINDINGS: Gallbladder:  Surgically absent  Common bile duct:  Diameter: 11 mm  Liver:  Mildly heterogeneous in echogenicity without focal lesion identified.  IMPRESSION: Common bile duct measures 11 mm in diameter. This is slightly greater than that expected for post cholecystectomy  state. In the setting of abnormal laboratory values, consider correlation with MRCP.   Electronically Signed   By: Annia Belt M.D.   On: 07/29/2014 12:19    2D Echo: Study Conclusions  - Left ventricle: Systolic function was normal. The estimated ejection fraction was in the range of 60% to 65%. Wall motion was normal; there were no regional wall motion abnormalities. Left ventricular diastolic function parameters were normal. - Mitral valve: Calcified annulus. - Pulmonary arteries: Systolic pressure was mildly to moderately increased. PA peak pressure: 47 mm Hg (S).  Admission HPI: Ms Bazar is a 74 year old woman with advanced dementia, sick sinus syndrome s/p PPM, paroxysmal atrial fibrillation on xarelto, HTN, HL here for fall. Ms Ellingwood lives alone but has 24 hour home health aid supervision. History was provided by son as Ms Taira has severe dementia. Her son says that, per the home health aid who witnessed event, at about 5:45 this morning she got up to use the bathroom, walked about four feet, and then fell. She landed on her R side and did not hit her head. She was allegedly awake the entire time. She was noted by EMS to have some dried stool on her legs but son is unsure about any incontinence. He says that she had another fall late Wednesday 4/13 or early Thursday 4/14 and hit her head as her aid caught her. She was not medically evaluated. He does not think there has been anything else unusual about his mother and she has not been complaining about anything such as fevers or dysuria. She currently says she has no complaints or pain but son notes she has had R hip pain after fall. Also of note, she was hospitalized about a year ago for syncope thought to be due to dehydration  In the ED, Ms Rogel was noted to have displaced R femoral neck fracture and ortho was consulted. She received morphine 4 mg iv once.  Hospital Course by problem list: Principal Problem:   Hip fracture,  right Active Problems:   Essential hypertension   Atrial fibrillation   SICK SINUS SYNDROME   PACEMAKER, PERMANENT   Repeated falls   Elevated transaminase level   Preop cardiovascular exam   #R femoral neck fracture s/p R hip hemiarthroplasty: Ms Lurry had displaced R femoral neck fracture noted on hip x-ray secondary to fall. She underwent successful R hip hemiarthroplasty 4/17 with  no noted complications after cardiology said she is poor candidate for aggressive cardiac evaluation but proceed with surgery for comfort and quality of life. She says her pain is adequately controlled currently and social work has found available SNF. SNF is to call and schedule follow-up with Dr Magnus Ivan in two weeks. She was discharged with oxy-ir for pain management as she is to avoid acetaminophen due to elevated transaminases on presentation that resolved with cessation of medicines described below.  #Fall: Ms Hendler has had two falls in about the past two days prior to presentation that were witnessed by home health aid. She hit head on first fall and has R periorbital ecchymosis but no fractures on CT. The fall the morning of presentation resulted in R femoral neck fracture. Although R knee pain on palpation for ortho, x-ray negative. The falls could be mechanical as they have occurred in the early morning and she has severe dementia. She was dehydrated on presentation and received NS IVF. PPM interrogated in ED and appears clear. TTE unrevealing other than mild to moderate PA pressure. Head CT without acute process but TIA possible. Seizure also considered as she was noted to have dry stool on EMS arrival and EEG on 4/18 noted R parieto-occipital sharp waves but neurology still has low suspicion and does not feel further work-up indicated as she has pacemaker and cannot get MRI. Also considered abuse in setting of severe dementia.  #Paroxysmal atrial fibrillation: Ms Dupriest is currently in normal sinus rhythm.  She is followed by Dr Hillis Range of [facility] cardiology. Last seen 03/12/14 with no new recommendations. She is on atenolol 100 mg daily, cartia xt 120 mg daily, xarelto 15 mg daily. She was seen by cardiology for surgery clearance and they recommend given her severe dementia, not continuing xarelto. Son was in agreement. PCP should revisit this on follow-up and discuss risks and benefits with family. Per cardiology she was started on ASA 325 mg daily prior to discharge.  #Advanced Dementia: Ms Akey is alert and oriented x 1 only to self. She could not answer simple review of system questions or engage in conversation. Her son said that this is her baseline. TSH wnl. He says she lives alone but has 24 hour home health aid. At home she is on donepezil 10 mg daily. She received PT/OT/SLP and was continued on her home donepezil. SLP recommends dysphagia 2 diet.  #Elevated transaminase: Ms Toft had AST 206 with ALT 45 on presentation up to 226 and 169 on 4/17. Her family denies any alcohol use. Abdominal ultrasound negative. Hepatitis panel negative. After holding tylenol and statin, AST and ALT this morning 25 and 31, respectively. She was told not to take any medication with Tylenol in it and to hold her statin. PCP can consider whether to resume a lower intensity statin.  #Sick sinus syndrome s/p PPM: Ms Laris is s/p permanent pacemaker secondary to sick sinus syndrome. She is followed by Dr Hillis Range of [facility] cardiology. Last seen 03/12/14 with normal pacemaker function and no changes. Interrogated in ED and appears normal. She is on cartia xt 120 mg daily which was continued.  #HTN: BP well controlled on home atenolol 10 mg daily, cartiz xt 120 mg daily, atorvastatin 10 mg daily. These were continued other than atorvastatin at noted above.  #HL: No lipid panel in our EMR. At home she is on atorvastatin 10 mg daily. We discontinued her atorvastatin 10 mg daily given elevated transaminases on  presentation as noted  above and PCP can consider whether to resume.  Discharge Vitals:   BP 93/46 mmHg  Pulse 67  Temp(Src) 99.8 F (37.7 C) (Oral)  Resp 16  Ht 5\' 5"  (1.651 m)  Wt 123 lb (55.792 kg)  BMI 20.47 kg/m2  SpO2 100%  Discharge Physical Exam: Gen: No acute distress, well developed, well nourished HEENT: R periorbital ecchymosis, chipped teeth, PERRL, EOMI, sclerae anicteric, moist mucous membranes Heart: Regular rate and rhythm, normal S1 S2, no murmurs, rubs, or gallops Lungs: Clear to auscultation bilaterally, respirations unlabored Abd: Soft, non-tender, non-distended, + bowel sounds, no hepatosplenomegaly Ext: Well bandaged incision on R hip, 2+ DP and PT pulses, no edema or cyanosis  Neuro: A&O x 1 (self), CN II-XII intact, strength 5/5 on finger squeeze, foot dorsiflexion and plantarflexion, sensation grossly intact, no Babinkski sign b/l  Discharge Labs:  Basic Metabolic Panel:  Recent Labs Lab 07/30/14 0540 07/31/14 0534  NA 134* 140  K 3.6 3.8  CL 98 106  CO2 27 27  GLUCOSE 138* 137*  BUN 9 8  CREATININE 0.84 0.68  CALCIUM 8.0* 8.0*   Liver Function Tests:  Recent Labs Lab 07/30/14 0540 07/31/14 0534  AST 53* 25  ALT 69* 31  ALKPHOS 86 86  BILITOT 1.2 0.7  PROT 5.3* 5.2*  ALBUMIN 2.4* 2.2*  CBC:  Recent Labs Lab 07/28/14 0742  07/30/14 0540 07/31/14 0534  WBC 12.6*  < > 10.3 8.1  NEUTROABS 11.1*  --   --   --   HGB 11.9*  < > 9.4* 9.6*  HCT 37.2  < > 29.7* 29.7*  MCV 90.7  < > 92.2 90.0  PLT 226  < > 159 173  < > = values in this interval not displayed. Cardiac Enzymes:  Recent Labs Lab 07/28/14 0742  CKTOTAL 67   CBG:  Recent Labs Lab 07/29/14 0648 07/30/14 0553 07/31/14 0630  GLUCAP 112* 137* 126*   Thyroid Function Tests:  Recent Labs Lab 07/28/14 1513  TSH 3.219   Coagulation:  Recent Labs Lab 07/28/14 0742 07/29/14 0520  LABPROT 16.9* 16.0*  INR 1.36 1.27   Urine Drug Screen: Drugs of Abuse       Component Value Date/Time   LABOPIA POSITIVE* 07/28/2014 0740   COCAINSCRNUR NONE DETECTED 07/28/2014 0740   LABBENZ NONE DETECTED 07/28/2014 0740   AMPHETMU NONE DETECTED 07/28/2014 0740   THCU NONE DETECTED 07/28/2014 0740   LABBARB NONE DETECTED 07/28/2014 0740    Urinalysis:  Recent Labs Lab 07/28/14 0740 07/31/14 0603  COLORURINE YELLOW YELLOW  LABSPEC 1.005 1.006  PHURINE 7.0 6.5  GLUCOSEU NEGATIVE NEGATIVE  HGBUR NEGATIVE NEGATIVE  BILIRUBINUR NEGATIVE NEGATIVE  KETONESUR NEGATIVE NEGATIVE  PROTEINUR NEGATIVE NEGATIVE  UROBILINOGEN 0.2 0.2  NITRITE NEGATIVE NEGATIVE  LEUKOCYTESUR SMALL* NEGATIVE   Signed: Lorenda Hatchet, MD 07/31/2014, 1:56 PM    Services Ordered on Discharge: none, to SNF Equipment Ordered on Discharge: none, to SNF

## 2014-08-24 NOTE — Consult Note (Signed)
Reason for Consult:RIGHT HIP FRACTURE Referring Physician:MCMANUS, K  Anna Sutton is an 74 y.o. female.  HPI: 37 Anna Sutton. FALL AT HOME THIS AM WITH A CAREGIVER UNABLE TO BEAR WEIGHT ON RIGHT LEG EMS CALLED. FAMILY REPORTS MECHANICAL FALL THIS PAST Thursday . FAMILY DENIES ANY LOC OR HEAD INJURY WITH EITHER FALL. STATES SHE GETS AROUND WELL WITHOUT ANY ASSISTIVE DEVICES LIVING AT HOME. HISTORY OF SYNCOPAL EPISODE ONE YEAR AGO PER SON WHO IS PRESENT BEDSIDE. FAMILY REPORTS PATIENT TO BE ON XARELTO , HISTORY OF A-FIB AND HAS A PACEMAKER.   Past Medical History  Diagnosis Date  . Paroxysmal atrial fibrillation   . Hyperlipidemia   . Hypertension   . Sick sinus syndrome     s/p PPM (MDT)  . Pacemaker     Past Surgical History  Procedure Laterality Date  . Pacemaker placement  12/13/08    MDT Adapta L implanted by Dr Rayann Heman    Family History  Problem Relation Age of Onset  . Heart failure      Social History:  reports that she has never smoked. She has never used smokeless tobacco. She reports that she does not drink alcohol or use illicit drugs.  Allergies: No Known Allergies  Medications: I have reviewed the patient's current medications.   Lab Results Last 48 Hours    Results for orders placed or performed during the hospital encounter of 07/28/14 (from the past 48 hour(s))  Urinalysis, Routine w reflex microscopic Status: Abnormal   Collection Time: 07/28/14 7:40 AM  Result Value Ref Range   Color, Urine YELLOW YELLOW   APPearance CLEAR CLEAR   Specific Gravity, Urine 1.005 1.005 - 1.030   pH 7.0 5.0 - 8.0   Glucose, UA NEGATIVE NEGATIVE mg/dL   Hgb urine dipstick NEGATIVE NEGATIVE   Bilirubin Urine NEGATIVE NEGATIVE   Ketones, ur NEGATIVE NEGATIVE mg/dL   Protein, ur NEGATIVE NEGATIVE mg/dL   Urobilinogen, UA 0.2 0.0 - 1.0 mg/dL   Nitrite  NEGATIVE NEGATIVE   Leukocytes, UA SMALL (A) NEGATIVE  Urine microscopic-add on Status: None   Collection Time: 07/28/14 7:40 AM  Result Value Ref Range   Squamous Epithelial / LPF RARE RARE   WBC, UA 3-6 <3 WBC/hpf  Comprehensive metabolic panel Status: Abnormal   Collection Time: 07/28/14 7:42 AM  Result Value Ref Range   Sodium 140 135 - 145 mmol/L   Potassium 3.8 3.5 - 5.1 mmol/L   Chloride 100 96 - 112 mmol/L   CO2 26 19 - 32 mmol/L   Glucose, Bld 171 (H) 70 - 99 mg/dL   BUN 12 6 - 23 mg/dL   Creatinine, Ser 0.91 0.50 - 1.10 mg/dL   Calcium 8.9 8.4 - 10.5 mg/dL   Total Protein 6.9 6.0 - 8.3 g/dL   Albumin 3.6 3.5 - 5.2 g/dL   AST 206 (H) 0 - 37 U/L   ALT 45 (H) 0 - 35 U/L   Alkaline Phosphatase 108 39 - 117 U/L   Total Bilirubin 0.9 0.3 - 1.2 mg/dL   GFR calc non Af Amer 60 (L) >90 mL/min   GFR calc Af Amer 69 (L) >90 mL/min    Comment: (NOTE) The eGFR has been calculated using the CKD EPI equation. This calculation has not been validated in all clinical situations. eGFR's persistently <90 mL/min signify possible Chronic Kidney Disease.    Anion gap 14 5 - 15  CBC with Differential Status: Abnormal   Collection Time:  07/28/14 7:42 AM  Result Value Ref Range   WBC 12.6 (H) 4.0 - 10.5 K/uL   RBC 4.10 3.87 - 5.11 MIL/uL   Hemoglobin 11.9 (L) 12.0 - 15.0 g/dL   HCT 37.2 36.0 - 46.0 %   MCV 90.7 78.0 - 100.0 fL   MCH 29.0 26.0 - 34.0 pg   MCHC 32.0 30.0 - 36.0 g/dL   RDW 14.0 11.5 - 15.5 %   Platelets 226 150 - 400 K/uL   Neutrophils Relative % 88 (H) 43 - 77 %   Neutro Abs 11.1 (H) 1.7 - 7.7 K/uL   Lymphocytes Relative 6 (L) 12 - 46 %   Lymphs Abs 0.7 0.7 - 4.0 K/uL   Monocytes Relative 6 3 - 12 %   Monocytes Absolute 0.7 0.1 - 1.0 K/uL   Eosinophils Relative 0 0 - 5 %   Eosinophils  Absolute 0.0 0.0 - 0.7 K/uL   Basophils Relative 0 0 - 1 %   Basophils Absolute 0.0 0.0 - 0.1 K/uL  Protime-INR Status: Abnormal   Collection Time: 07/28/14 7:42 AM  Result Value Ref Range   Prothrombin Time 16.9 (H) 11.6 - 15.2 seconds   INR 1.36 0.00 - 1.49  CK Status: None   Collection Time: 07/28/14 7:42 AM  Result Value Ref Range   Total CK 67 7 - 177 U/L  I-stat troponin, ED Status: None   Collection Time: 07/28/14 7:56 AM  Result Value Ref Range   Troponin i, poc 0.01 0.00 - 0.08 ng/mL   Comment 3       Comment: Due to the release kinetics of cTnI, a negative result within the first hours of the onset of symptoms does not rule out myocardial infarction with certainty. If myocardial infarction is still suspected, repeat the test at appropriate intervals.   I-Stat CG4 Lactic Acid, ED Status: Abnormal   Collection Time: 07/28/14 7:58 AM  Result Value Ref Range   Lactic Acid, Venous 2.25 (HH) 0.5 - 2.0 mmol/L   Comment NOTIFIED PHYSICIAN        Imaging Results (Last 48 hours)    Dg Chest 1 View  07/28/2014 CLINICAL DATA: Pt fell this morning, seems to be very confused. She has right hip pain, and her foot is rotated inward. No known chest complaints Hx of paroxysmal atrail fibrillation, sick sinus syndrome, HTN EXAM: CHEST 1 VIEW COMPARISON: 12/14/2008 FINDINGS: Cardiac silhouette mildly enlarged. Normal mediastinal and hilar contours. Clear lungs. No pleural effusion or pneumothorax. Left anterior chest wall sequential pacemaker is stable and well positioned. Bony thorax is intact. IMPRESSION: No active disease. Electronically Signed By: Lajean Manes M.D. On: 07/28/2014 08:45   Ct Head Wo Contrast  07/28/2014 CLINICAL DATA: RECENT FALL, PT. WITH RIGHT ORBITAL HEMATOMA-GREENISH/PURPLEISH IN COLOR, PT. UNABLE TO MOVE WITHOUT ASSISTANCE, EXAM: CT HEAD WITHOUT  CONTRAST CT MAXILLOFACIAL WITHOUT CONTRAST CT CERVICAL SPINE WITHOUT CONTRAST TECHNIQUE: Multidetector CT imaging of the head, cervical spine, and maxillofacial structures were performed using the standard protocol without intravenous contrast. Multiplanar CT image reconstructions of the cervical spine and maxillofacial structures were also generated. COMPARISON: 03/06/2014 FINDINGS: CT HEAD FINDINGS Ventricles normal configuration. There is ventricular and sulcal enlargement reflecting mild to moderate atrophy. No hydrocephalus. There are no parenchymal masses or mass effect. There is no evidence of a cortical infarct. Mild periventricular white matter hypoattenuation is noted, stable, consistent with chronic small-vessel ischemic change. There are no extra-axial masses or abnormal fluid collections. There is no intracranial hemorrhage. No skull  fracture. CT MAXILLOFACIAL FINDINGS No fracture. Small foci of mucosal thickening in each maxillary sinus. Sinuses otherwise clear. Clear mastoid air cells and middle ear cavities. Globes orbits are unremarkable. No soft tissue masses. Mild right lateral periorbital soft tissue edema. Degenerative changes are noted of both temporomandibular joints. CT CERVICAL SPINE FINDINGS No fracture. No spondylolisthesis. There are mild disc degenerative changes from C3-C4 through C5-C6 with endplate spurring and mild disc bulging by without significant loss disc height. There is no significant central stenosis or neural foraminal narrowing. Soft tissues show carotid vascular calcifications but are otherwise unremarkable. Lung apices are clear. IMPRESSION: HEAD CT: No acute intracranial abnormalities. No skull fracture. MAXILLOFACIAL CT: No fracture. CERVICAL CT: No fracture or acute finding. Electronically Signed By: Lajean Manes M.D. On: 07/28/2014 08:35   Ct Cervical Spine Wo Contrast  07/28/2014 CLINICAL DATA: RECENT FALL, PT. WITH RIGHT ORBITAL  HEMATOMA-GREENISH/PURPLEISH IN COLOR, PT. UNABLE TO MOVE WITHOUT ASSISTANCE, EXAM: CT HEAD WITHOUT CONTRAST CT MAXILLOFACIAL WITHOUT CONTRAST CT CERVICAL SPINE WITHOUT CONTRAST TECHNIQUE: Multidetector CT imaging of the head, cervical spine, and maxillofacial structures were performed using the standard protocol without intravenous contrast. Multiplanar CT image reconstructions of the cervical spine and maxillofacial structures were also generated. COMPARISON: 03/06/2014 FINDINGS: CT HEAD FINDINGS Ventricles normal configuration. There is ventricular and sulcal enlargement reflecting mild to moderate atrophy. No hydrocephalus. There are no parenchymal masses or mass effect. There is no evidence of a cortical infarct. Mild periventricular white matter hypoattenuation is noted, stable, consistent with chronic small-vessel ischemic change. There are no extra-axial masses or abnormal fluid collections. There is no intracranial hemorrhage. No skull fracture. CT MAXILLOFACIAL FINDINGS No fracture. Small foci of mucosal thickening in each maxillary sinus. Sinuses otherwise clear. Clear mastoid air cells and middle ear cavities. Globes orbits are unremarkable. No soft tissue masses. Mild right lateral periorbital soft tissue edema. Degenerative changes are noted of both temporomandibular joints. CT CERVICAL SPINE FINDINGS No fracture. No spondylolisthesis. There are mild disc degenerative changes from C3-C4 through C5-C6 with endplate spurring and mild disc bulging by without significant loss disc height. There is no significant central stenosis or neural foraminal narrowing. Soft tissues show carotid vascular calcifications but are otherwise unremarkable. Lung apices are clear. IMPRESSION: HEAD CT: No acute intracranial abnormalities. No skull fracture. MAXILLOFACIAL CT: No fracture. CERVICAL CT: No fracture or acute finding. Electronically Signed By: Lajean Manes M.D. On: 07/28/2014 08:35    Dg Hip Unilat With Pelvis 2-3 Views Right  07/28/2014 CLINICAL DATA: Pt fell this morning, seems to be very confused. She has right hip pain, and her foot is rotated inward. No known chest complaints Hx of paroxysmal atrail fibrillation, sick sinus syndrome, HTN EXAM: RIGHT HIP (WITH PELVIS) 2-3 VIEWS COMPARISON: None. FINDINGS: There is a fracture of the right femoral neck. Fracture is mid cervical. There is a single comminuted fracture fragment along the superior margin of fracture. The shaft fracture component has migrated superiorly, displacing 2.5 cm in relation to femoral head neck component. There is mild varus angulation. No other fractures. Hip joints are normally aligned. Bones are diffusely demineralized. IMPRESSION: Displaced right femoral neck fracture as described. Electronically Signed By: Lajean Manes M.D. On: 07/28/2014 08:46   Ct Maxillofacial Wo Cm  07/28/2014 CLINICAL DATA: RECENT FALL, PT. WITH RIGHT ORBITAL HEMATOMA-GREENISH/PURPLEISH IN COLOR, PT. UNABLE TO MOVE WITHOUT ASSISTANCE, EXAM: CT HEAD WITHOUT CONTRAST CT MAXILLOFACIAL WITHOUT CONTRAST CT CERVICAL SPINE WITHOUT CONTRAST TECHNIQUE: Multidetector CT imaging of the head, cervical spine, and maxillofacial structures were performed  using the standard protocol without intravenous contrast. Multiplanar CT image reconstructions of the cervical spine and maxillofacial structures were also generated. COMPARISON: 03/06/2014 FINDINGS: CT HEAD FINDINGS Ventricles normal configuration. There is ventricular and sulcal enlargement reflecting mild to moderate atrophy. No hydrocephalus. There are no parenchymal masses or mass effect. There is no evidence of a cortical infarct. Mild periventricular white matter hypoattenuation is noted, stable, consistent with chronic small-vessel ischemic change. There are no extra-axial masses or abnormal fluid collections. There is no intracranial hemorrhage. No skull  fracture. CT MAXILLOFACIAL FINDINGS No fracture. Small foci of mucosal thickening in each maxillary sinus. Sinuses otherwise clear. Clear mastoid air cells and middle ear cavities. Globes orbits are unremarkable. No soft tissue masses. Mild right lateral periorbital soft tissue edema. Degenerative changes are noted of both temporomandibular joints. CT CERVICAL SPINE FINDINGS No fracture. No spondylolisthesis. There are mild disc degenerative changes from C3-C4 through C5-C6 with endplate spurring and mild disc bulging by without significant loss disc height. There is no significant central stenosis or neural foraminal narrowing. Soft tissues show carotid vascular calcifications but are otherwise unremarkable. Lung apices are clear. IMPRESSION: HEAD CT: No acute intracranial abnormalities. No skull fracture. MAXILLOFACIAL CT: No fracture. CERVICAL CT: No fracture or acute finding. Electronically Signed By: Lajean Manes M.D. On: 07/28/2014 08:35     Review of Systems  Cardiovascular:   PAROXSYMAL ATRIAL FIBRILLATION SICK SINUS SYNDROME PACEMAKER HTN HISTORY OF SYNCOPAL EPISODE   Musculoskeletal: Positive for falls.   RIGHT HIP PAIN S/P FALL  Psychiatric/Behavioral:   DEMENTIA   Blood pressure 141/70, pulse 63, temperature 98.4 F (36.9 C), temperature source Rectal, resp. rate 18, SpO2 96 %. Physical Exam  Constitutional:  FRAIL APPEARING ELDERLY FEMALE  HENT:  Head: Normocephalic.  ECCHYMOSIS RIGHT ORBIT  Eyes: EOM are normal.  Cardiovascular: Normal rate.  Respiratory: Effort normal.  GI: Soft.  Musculoskeletal:  RIGHT LEG SHORTENED AND EXTERNALLY ROTATED. TENDERNESS RIGHT KNEE LATERAL JOINT LINE. RIGHT KNEE NO EFFUSION OR SKIN BREAKDOWN. LEFT LEG NON TENDER NO GROSS DEFORMITIES. GENTLE RANGE OF MOTION RIGHT HIP FLUID MOTION WITHOUT PAIN. ABLE TO DORSIFLEX AND PLANTAR FLEX.  UPPER EXTREMITIES WITHOUT OBVIOUS DEFORMITIES OR TENDERNESS. RIGHT  POSTERIOR ELBOW WITH AN AREA SCABBED OVER NO SIGNS OF INFECTION.  Neurological:  FOLLOWS COMMANDS AND WILL ANSWER NO AND YES BUT DOES NOT ANSWER IN FULL SENTENCES.  Skin: Skin is warm and dry.    Assessment/Plan: RIGHT HIP DISPLACED FEMORAL NECK FRACTURE WILL NEED RIGHT HIP HEMIARTHROPLASTY FOR COMFORT AND QUALITY OF LIFE . SPOKE WITH SON ABOUT NEED FOR SURGERY . HOWEVER WILL NEED OPTIMIZATION FROM MEDICAL STANDPOINT AND NEED TO BE OFF ANTICOAGS PRIOR TO SURGICAL INTERVENTION.   RIGHT KNEE PAIN WILL OBTAIN RADIOGRAPHS TO R/O ACUTE FRACTURE.  MAY EAT FROM ORTHOPAEDIC STANDPOINT   STRICT BED REST   RIGHT HEMIARTHROPLASTY IN [place] PA-C  07/28/2014, 9:32 AM         Reason for Consult:RIGHT HIP FRACTURE Referring Physician:MCMANUS, K  Anna Sutton is an 75 y.o. female.  HPI: 55 [place]. FALL AT HOME THIS AM WITH A CAREGIVER UNABLE TO BEAR WEIGHT ON RIGHT LEG EMS CALLED. FAMILY REPORTS MECHANICAL FALL THIS PAST Thursday . FAMILY DENIES ANY LOC OR HEAD INJURY WITH EITHER FALL. STATES SHE GETS AROUND WELL WITHOUT ANY ASSISTIVE DEVICES LIVING AT HOME. HISTORY OF SYNCOPAL EPISODE ONE YEAR AGO PER SON WHO IS PRESENT BEDSIDE. FAMILY REPORTS PATIENT TO BE ON XARELTO , HISTORY OF A-FIB  AND HAS A PACEMAKER.   Past Medical History  Diagnosis Date  . Paroxysmal atrial fibrillation   . Hyperlipidemia   . Hypertension   . Sick sinus syndrome     s/p PPM (MDT)  . Pacemaker     Past Surgical History  Procedure Laterality Date  . Pacemaker placement  12/13/08    MDT Adapta L implanted by Dr Rayann Heman    Family History  Problem Relation Age of Onset  . Heart failure      Social History:  reports that she has never smoked. She has never used smokeless tobacco. She reports that she does not drink alcohol or use illicit drugs.  Allergies: No Known Allergies  Medications: I have reviewed the patient's  current medications.   Lab Results Last 48 Hours    Results for orders placed or performed during the hospital encounter of 07/28/14 (from the past 48 hour(s))  Urinalysis, Routine w reflex microscopic Status: Abnormal   Collection Time: 07/28/14 7:40 AM  Result Value Ref Range   Color, Urine YELLOW YELLOW   APPearance CLEAR CLEAR   Specific Gravity, Urine 1.005 1.005 - 1.030   pH 7.0 5.0 - 8.0   Glucose, UA NEGATIVE NEGATIVE mg/dL   Hgb urine dipstick NEGATIVE NEGATIVE   Bilirubin Urine NEGATIVE NEGATIVE   Ketones, ur NEGATIVE NEGATIVE mg/dL   Protein, ur NEGATIVE NEGATIVE mg/dL   Urobilinogen, UA 0.2 0.0 - 1.0 mg/dL   Nitrite NEGATIVE NEGATIVE   Leukocytes, UA SMALL (A) NEGATIVE  Urine microscopic-add on Status: None   Collection Time: 07/28/14 7:40 AM  Result Value Ref Range   Squamous Epithelial / LPF RARE RARE   WBC, UA 3-6 <3 WBC/hpf  Comprehensive metabolic panel Status: Abnormal   Collection Time: 07/28/14 7:42 AM  Result Value Ref Range   Sodium 140 135 - 145 mmol/L   Potassium 3.8 3.5 - 5.1 mmol/L   Chloride 100 96 - 112 mmol/L   CO2 26 19 - 32 mmol/L   Glucose, Bld 171 (H) 70 - 99 mg/dL   BUN 12 6 - 23 mg/dL   Creatinine, Ser 0.91 0.50 - 1.10 mg/dL   Calcium 8.9 8.4 - 10.5 mg/dL   Total Protein 6.9 6.0 - 8.3 g/dL   Albumin 3.6 3.5 - 5.2 g/dL   AST 206 (H) 0 - 37 U/L   ALT 45 (H) 0 - 35 U/L   Alkaline Phosphatase 108 39 - 117 U/L   Total Bilirubin 0.9 0.3 - 1.2 mg/dL   GFR calc non Af Amer 60 (L) >90 mL/min   GFR calc Af Amer 69 (L) >90 mL/min    Comment: (NOTE) The eGFR has been calculated using the CKD EPI equation. This calculation has not been validated in all clinical situations. eGFR's persistently <90 mL/min signify possible Chronic Kidney Disease.    Anion gap 14 5 - 15  CBC with  Differential Status: Abnormal   Collection Time: 07/28/14 7:42 AM  Result Value Ref Range   WBC 12.6 (H) 4.0 - 10.5 K/uL   RBC 4.10 3.87 - 5.11 MIL/uL   Hemoglobin 11.9 (L) 12.0 - 15.0 g/dL   HCT 37.2 36.0 - 46.0 %   MCV 90.7 78.0 - 100.0 fL   MCH 29.0 26.0 - 34.0 pg   MCHC 32.0 30.0 - 36.0 g/dL   RDW 14.0 11.5 - 15.5 %   Platelets 226 150 - 400 K/uL   Neutrophils Relative % 88 (H) 43 - 77 %  Neutro Abs 11.1 (H) 1.7 - 7.7 K/uL   Lymphocytes Relative 6 (L) 12 - 46 %   Lymphs Abs 0.7 0.7 - 4.0 K/uL   Monocytes Relative 6 3 - 12 %   Monocytes Absolute 0.7 0.1 - 1.0 K/uL   Eosinophils Relative 0 0 - 5 %   Eosinophils Absolute 0.0 0.0 - 0.7 K/uL   Basophils Relative 0 0 - 1 %   Basophils Absolute 0.0 0.0 - 0.1 K/uL  Protime-INR Status: Abnormal   Collection Time: 07/28/14 7:42 AM  Result Value Ref Range   Prothrombin Time 16.9 (H) 11.6 - 15.2 seconds   INR 1.36 0.00 - 1.49  CK Status: None   Collection Time: 07/28/14 7:42 AM  Result Value Ref Range   Total CK 67 7 - 177 U/L  I-stat troponin, ED Status: None   Collection Time: 07/28/14 7:56 AM  Result Value Ref Range   Troponin i, poc 0.01 0.00 - 0.08 ng/mL   Comment 3       Comment: Due to the release kinetics of cTnI, a negative result within the first hours of the onset of symptoms does not rule out myocardial infarction with certainty. If myocardial infarction is still suspected, repeat the test at appropriate intervals.   I-Stat CG4 Lactic Acid, ED Status: Abnormal   Collection Time: 07/28/14 7:58 AM  Result Value Ref Range   Lactic Acid, Venous 2.25 (HH) 0.5 - 2.0 mmol/L   Comment NOTIFIED PHYSICIAN        Imaging Results (Last 48 hours)    Dg Chest 1 View  07/28/2014 CLINICAL DATA: Pt fell this morning, seems to be very confused. She has right  hip pain, and her foot is rotated inward. No known chest complaints Hx of paroxysmal atrail fibrillation, sick sinus syndrome, HTN EXAM: CHEST 1 VIEW COMPARISON: 12/14/2008 FINDINGS: Cardiac silhouette mildly enlarged. Normal mediastinal and hilar contours. Clear lungs. No pleural effusion or pneumothorax. Left anterior chest wall sequential pacemaker is stable and well positioned. Bony thorax is intact. IMPRESSION: No active disease. Electronically Signed By: Lajean Manes M.D. On: 07/28/2014 08:45   Ct Head Wo Contrast  07/28/2014 CLINICAL DATA: RECENT FALL, PT. WITH RIGHT ORBITAL HEMATOMA-GREENISH/PURPLEISH IN COLOR, PT. UNABLE TO MOVE WITHOUT ASSISTANCE, EXAM: CT HEAD WITHOUT CONTRAST CT MAXILLOFACIAL WITHOUT CONTRAST CT CERVICAL SPINE WITHOUT CONTRAST TECHNIQUE: Multidetector CT imaging of the head, cervical spine, and maxillofacial structures were performed using the standard protocol without intravenous contrast. Multiplanar CT image reconstructions of the cervical spine and maxillofacial structures were also generated. COMPARISON: 03/06/2014 FINDINGS: CT HEAD FINDINGS Ventricles normal configuration. There is ventricular and sulcal enlargement reflecting mild to moderate atrophy. No hydrocephalus. There are no parenchymal masses or mass effect. There is no evidence of a cortical infarct. Mild periventricular white matter hypoattenuation is noted, stable, consistent with chronic small-vessel ischemic change. There are no extra-axial masses or abnormal fluid collections. There is no intracranial hemorrhage. No skull fracture. CT MAXILLOFACIAL FINDINGS No fracture. Small foci of mucosal thickening in each maxillary sinus. Sinuses otherwise clear. Clear mastoid air cells and middle ear cavities. Globes orbits are unremarkable. No soft tissue masses. Mild right lateral periorbital soft tissue edema. Degenerative changes are noted of both temporomandibular joints. CT CERVICAL  SPINE FINDINGS No fracture. No spondylolisthesis. There are mild disc degenerative changes from C3-C4 through C5-C6 with endplate spurring and mild disc bulging by without significant loss disc height. There is no significant central stenosis or neural foraminal narrowing. Soft tissues show carotid vascular  calcifications but are otherwise unremarkable. Lung apices are clear. IMPRESSION: HEAD CT: No acute intracranial abnormalities. No skull fracture. MAXILLOFACIAL CT: No fracture. CERVICAL CT: No fracture or acute finding. Electronically Signed By: Lajean Manes M.D. On: 07/28/2014 08:35   Ct Cervical Spine Wo Contrast  07/28/2014 CLINICAL DATA: RECENT FALL, PT. WITH RIGHT ORBITAL HEMATOMA-GREENISH/PURPLEISH IN COLOR, PT. UNABLE TO MOVE WITHOUT ASSISTANCE, EXAM: CT HEAD WITHOUT CONTRAST CT MAXILLOFACIAL WITHOUT CONTRAST CT CERVICAL SPINE WITHOUT CONTRAST TECHNIQUE: Multidetector CT imaging of the head, cervical spine, and maxillofacial structures were performed using the standard protocol without intravenous contrast. Multiplanar CT image reconstructions of the cervical spine and maxillofacial structures were also generated. COMPARISON: 03/06/2014 FINDINGS: CT HEAD FINDINGS Ventricles normal configuration. There is ventricular and sulcal enlargement reflecting mild to moderate atrophy. No hydrocephalus. There are no parenchymal masses or mass effect. There is no evidence of a cortical infarct. Mild periventricular white matter hypoattenuation is noted, stable, consistent with chronic small-vessel ischemic change. There are no extra-axial masses or abnormal fluid collections. There is no intracranial hemorrhage. No skull fracture. CT MAXILLOFACIAL FINDINGS No fracture. Small foci of mucosal thickening in each maxillary sinus. Sinuses otherwise clear. Clear mastoid air cells and middle ear cavities. Globes orbits are unremarkable. No soft tissue masses. Mild right lateral  periorbital soft tissue edema. Degenerative changes are noted of both temporomandibular joints. CT CERVICAL SPINE FINDINGS No fracture. No spondylolisthesis. There are mild disc degenerative changes from C3-C4 through C5-C6 with endplate spurring and mild disc bulging by without significant loss disc height. There is no significant central stenosis or neural foraminal narrowing. Soft tissues show carotid vascular calcifications but are otherwise unremarkable. Lung apices are clear. IMPRESSION: HEAD CT: No acute intracranial abnormalities. No skull fracture. MAXILLOFACIAL CT: No fracture. CERVICAL CT: No fracture or acute finding. Electronically Signed By: Lajean Manes M.D. On: 07/28/2014 08:35   Dg Hip Unilat With Pelvis 2-3 Views Right  07/28/2014 CLINICAL DATA: Pt fell this morning, seems to be very confused. She has right hip pain, and her foot is rotated inward. No known chest complaints Hx of paroxysmal atrail fibrillation, sick sinus syndrome, HTN EXAM: RIGHT HIP (WITH PELVIS) 2-3 VIEWS COMPARISON: None. FINDINGS: There is a fracture of the right femoral neck. Fracture is mid cervical. There is a single comminuted fracture fragment along the superior margin of fracture. The shaft fracture component has migrated superiorly, displacing 2.5 cm in relation to femoral head neck component. There is mild varus angulation. No other fractures. Hip joints are normally aligned. Bones are diffusely demineralized. IMPRESSION: Displaced right femoral neck fracture as described. Electronically Signed By: Lajean Manes M.D. On: 07/28/2014 08:46   Ct Maxillofacial Wo Cm  07/28/2014 CLINICAL DATA: RECENT FALL, PT. WITH RIGHT ORBITAL HEMATOMA-GREENISH/PURPLEISH IN COLOR, PT. UNABLE TO MOVE WITHOUT ASSISTANCE, EXAM: CT HEAD WITHOUT CONTRAST CT MAXILLOFACIAL WITHOUT CONTRAST CT CERVICAL SPINE WITHOUT CONTRAST TECHNIQUE: Multidetector CT imaging of the head, cervical spine, and  maxillofacial structures were performed using the standard protocol without intravenous contrast. Multiplanar CT image reconstructions of the cervical spine and maxillofacial structures were also generated. COMPARISON: 03/06/2014 FINDINGS: CT HEAD FINDINGS Ventricles normal configuration. There is ventricular and sulcal enlargement reflecting mild to moderate atrophy. No hydrocephalus. There are no parenchymal masses or mass effect. There is no evidence of a cortical infarct. Mild periventricular white matter hypoattenuation is noted, stable, consistent with chronic small-vessel ischemic change. There are no extra-axial masses or abnormal fluid collections. There is no intracranial hemorrhage. No skull fracture. CT MAXILLOFACIAL FINDINGS No fracture.  Small foci of mucosal thickening in each maxillary sinus. Sinuses otherwise clear. Clear mastoid air cells and middle ear cavities. Globes orbits are unremarkable. No soft tissue masses. Mild right lateral periorbital soft tissue edema. Degenerative changes are noted of both temporomandibular joints. CT CERVICAL SPINE FINDINGS No fracture. No spondylolisthesis. There are mild disc degenerative changes from C3-C4 through C5-C6 with endplate spurring and mild disc bulging by without significant loss disc height. There is no significant central stenosis or neural foraminal narrowing. Soft tissues show carotid vascular calcifications but are otherwise unremarkable. Lung apices are clear. IMPRESSION: HEAD CT: No acute intracranial abnormalities. No skull fracture. MAXILLOFACIAL CT: No fracture. CERVICAL CT: No fracture or acute finding. Electronically Signed By: Lajean Manes M.D. On: 07/28/2014 08:35     Review of Systems  Cardiovascular:   PAROXSYMAL ATRIAL FIBRILLATION SICK SINUS SYNDROME PACEMAKER HTN HISTORY OF SYNCOPAL EPISODE   Musculoskeletal: Positive for falls.   RIGHT HIP PAIN S/P FALL  Psychiatric/Behavioral:    DEMENTIA   Blood pressure 141/70, pulse 63, temperature 98.4 F (36.9 C), temperature source Rectal, resp. rate 18, SpO2 96 %. Physical Exam  Constitutional:  FRAIL APPEARING ELDERLY FEMALE  HENT:  Head: Normocephalic.  ECCHYMOSIS RIGHT ORBIT  Eyes: EOM are normal.  Cardiovascular: Normal rate.  Respiratory: Effort normal.  GI: Soft.  Musculoskeletal:  RIGHT LEG SHORTENED AND EXTERNALLY ROTATED. TENDERNESS RIGHT KNEE LATERAL JOINT LINE. RIGHT KNEE NO EFFUSION OR SKIN BREAKDOWN. LEFT LEG NON TENDER NO GROSS DEFORMITIES. GENTLE RANGE OF MOTION RIGHT HIP FLUID MOTION WITHOUT PAIN. ABLE TO DORSIFLEX AND PLANTAR FLEX.  UPPER EXTREMITIES WITHOUT OBVIOUS DEFORMITIES OR TENDERNESS. RIGHT POSTERIOR ELBOW WITH AN AREA SCABBED OVER NO SIGNS OF INFECTION.  Neurological:  FOLLOWS COMMANDS AND WILL ANSWER NO AND YES BUT DOES NOT ANSWER IN FULL SENTENCES.  Skin: Skin is warm and dry.    Assessment/Plan: RIGHT HIP DISPLACED FEMORAL NECK FRACTURE WILL NEED RIGHT HIP HEMIARTHROPLASTY FOR COMFORT AND QUALITY OF LIFE . SPOKE WITH SON ABOUT NEED FOR SURGERY . HOWEVER WILL NEED OPTIMIZATION FROM MEDICAL STANDPOINT AND NEED TO BE OFF ANTICOAGS PRIOR TO SURGICAL INTERVENTION.   RIGHT KNEE PAIN WILL OBTAIN RADIOGRAPHS TO R/O ACUTE FRACTURE.  MAY EAT FROM ORTHOPAEDIC STANDPOINT   STRICT BED REST   RIGHT HEMIARTHROPLASTY IN [place] PA-C  07/28/2014, 9:32 AM

## 2015-01-12 DEATH — deceased

## 2015-03-15 ENCOUNTER — Encounter: Payer: Medicare Other | Admitting: Internal Medicine

## 2015-11-12 IMAGING — US US ABDOMEN LIMITED
1 series · 14 of 25 positions shown · non-contrast
Comparison: None.

CLINICAL DATA: Elevated transaminases.  Post cholecystectomy.

EXAM:
US ABDOMEN LIMITED - RIGHT UPPER QUADRANT

[Series 1: us abdomen limited · 0.20mm/px · 14 of 39 slices shown]
[im 1/39]
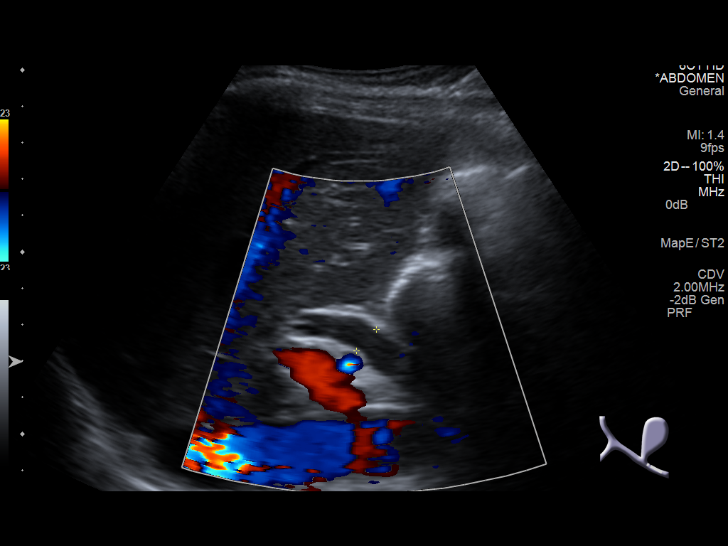
[im 4/39]
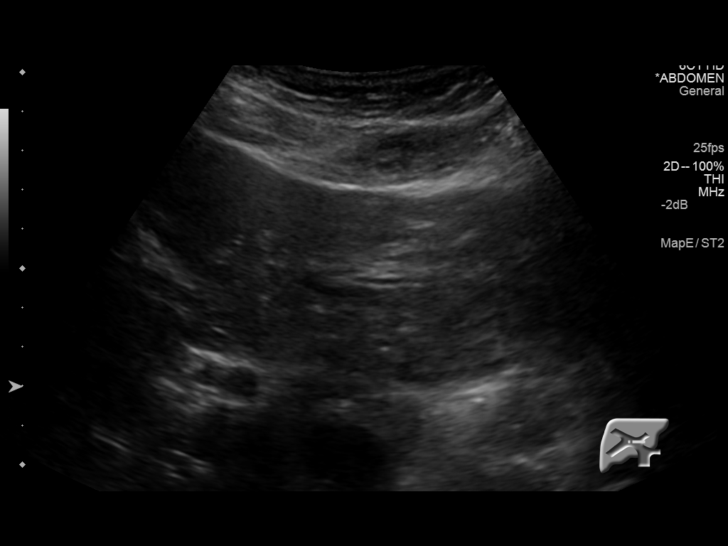
[im 7/39]
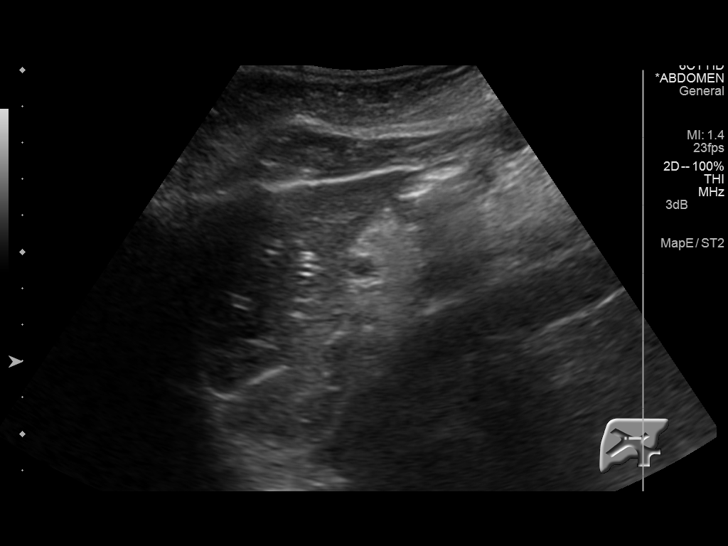
[im 10/39]
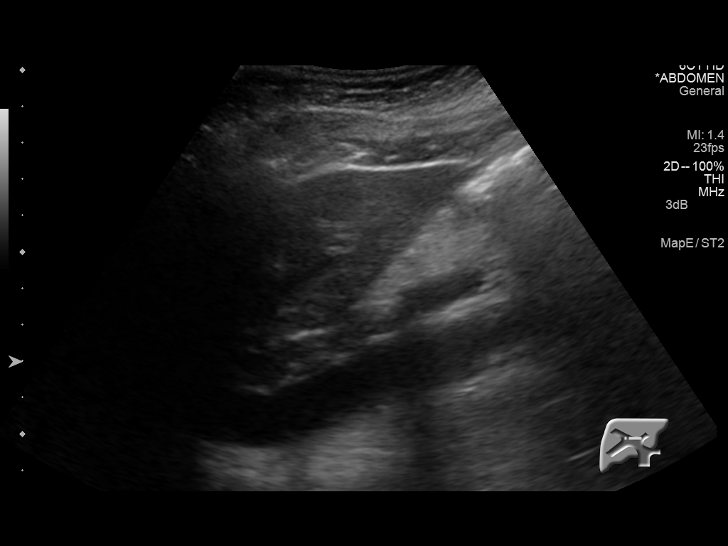
[im 13/39]
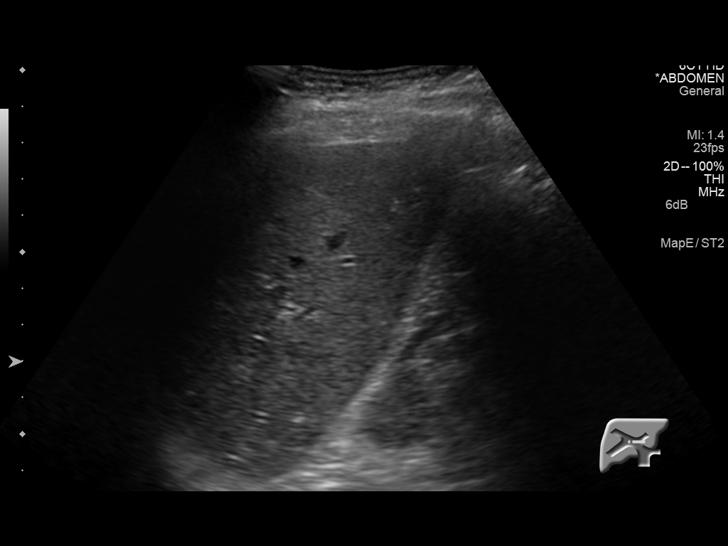
[im 15/39]
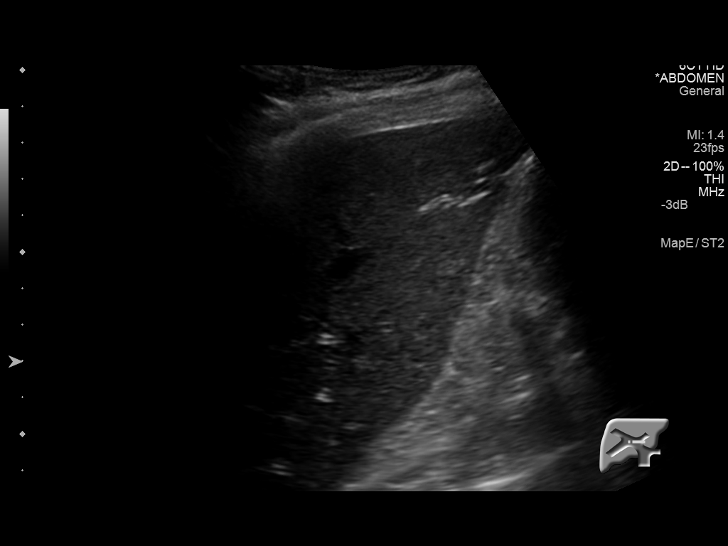
[im 18/39]
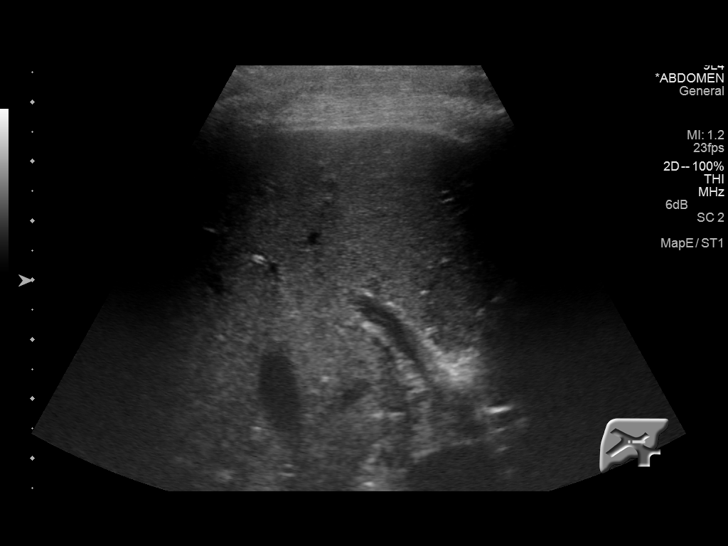
[im 21/39]
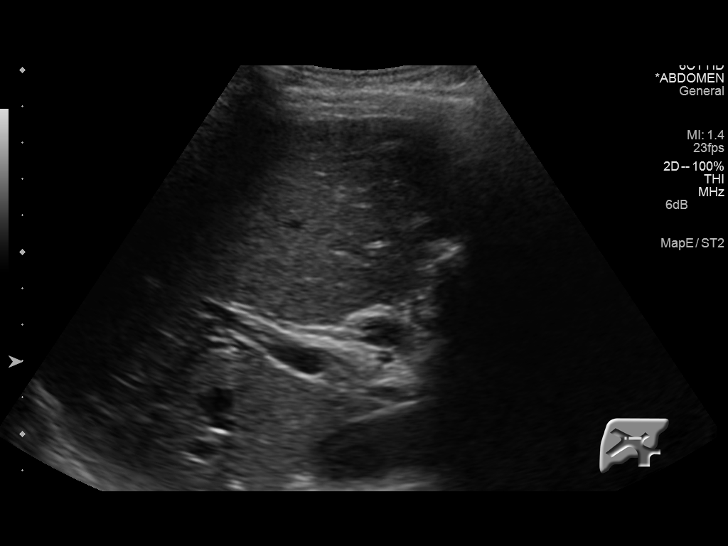
[im 24/39]
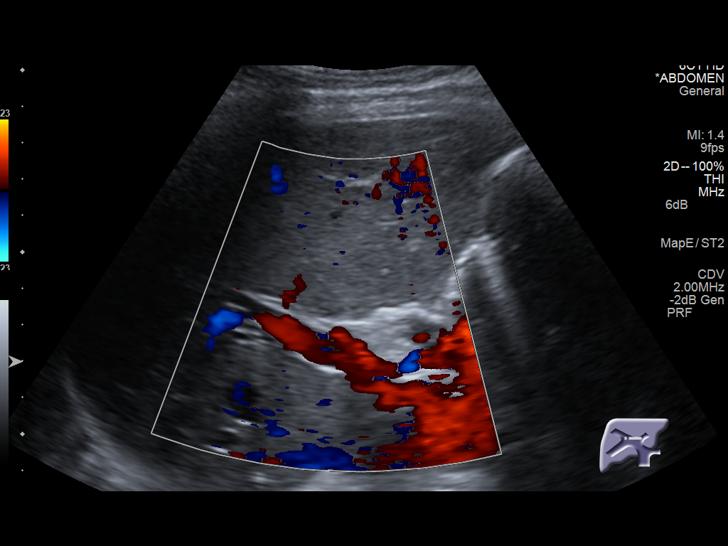
[im 26/39]
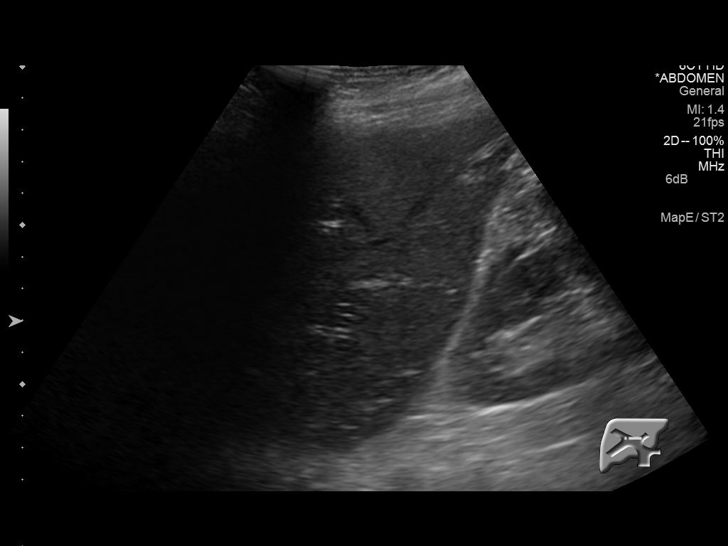
[im 29/39]
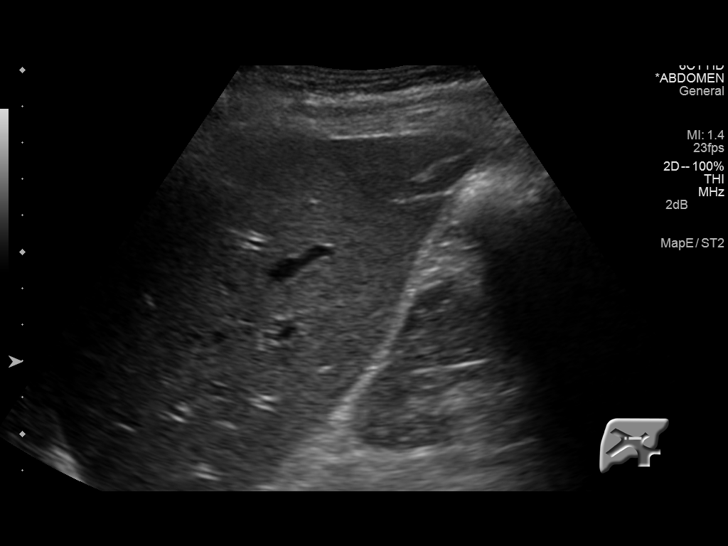
[im 32/39]
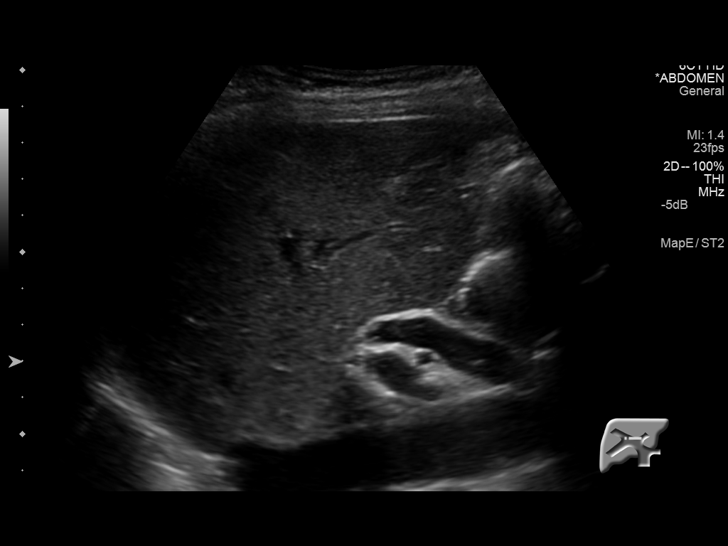
[im 35/39]
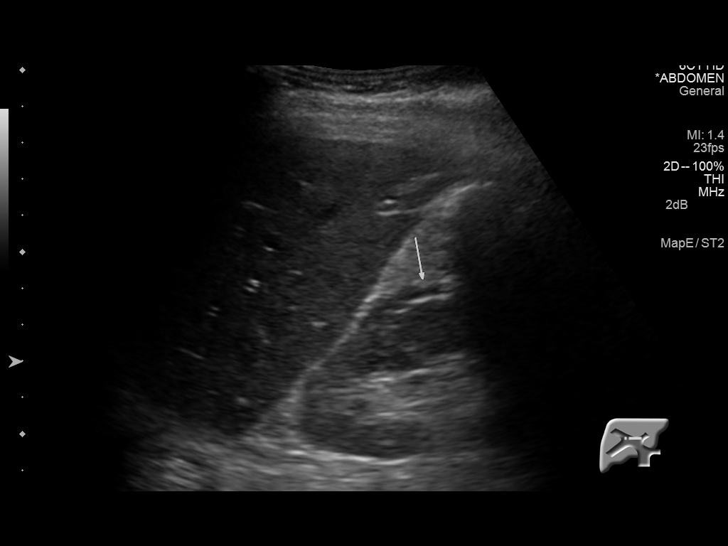
[im 39/39]
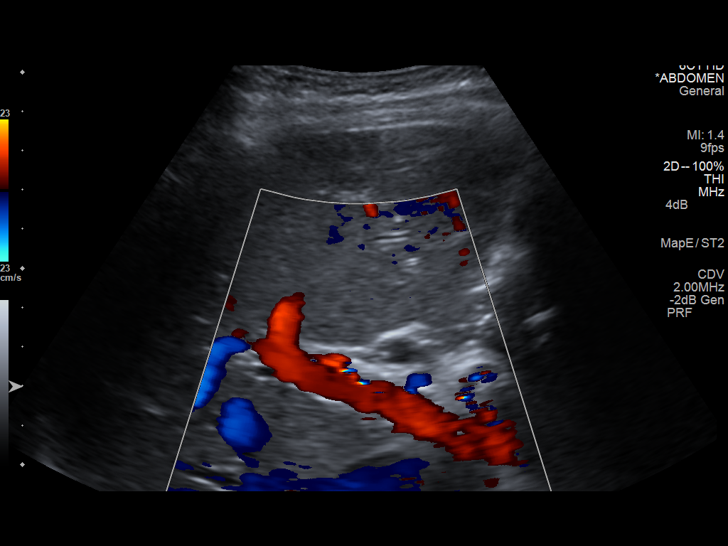

[14 of 25 positions shown; findings below may reference images not displayed]

FINDINGS: Gallbladder:

Surgically absent

Common bile duct:

Diameter: 11 mm

Liver:

Mildly heterogeneous in echogenicity without focal lesion
identified.
IMPRESSION: Common bile duct measures 11 mm in diameter. This is slightly
greater than that expected for post cholecystectomy state. In the
setting of abnormal laboratory values, consider correlation with
MRCP.
